# Patient Record
Sex: Female | Born: 1940 | Race: Black or African American | Hispanic: No | Smoking: Former smoker
Health system: Southern US, Community
[De-identification: ages and names within clinical notes are randomized; demographics above are authoritative.]

## PROBLEM LIST (undated history)

## (undated) DIAGNOSIS — M1990 Unspecified osteoarthritis, unspecified site: Secondary | ICD-10-CM

## (undated) DIAGNOSIS — Z5189 Encounter for other specified aftercare: Secondary | ICD-10-CM

## (undated) DIAGNOSIS — R0602 Shortness of breath: Secondary | ICD-10-CM

## (undated) DIAGNOSIS — I10 Essential (primary) hypertension: Secondary | ICD-10-CM

## (undated) DIAGNOSIS — M4800 Spinal stenosis, site unspecified: Secondary | ICD-10-CM

## (undated) DIAGNOSIS — H409 Unspecified glaucoma: Secondary | ICD-10-CM

## (undated) DIAGNOSIS — E785 Hyperlipidemia, unspecified: Secondary | ICD-10-CM

## (undated) DIAGNOSIS — M797 Fibromyalgia: Secondary | ICD-10-CM

## (undated) DIAGNOSIS — T7840XA Allergy, unspecified, initial encounter: Secondary | ICD-10-CM

## (undated) DIAGNOSIS — N289 Disorder of kidney and ureter, unspecified: Secondary | ICD-10-CM

## (undated) HISTORY — DX: Unspecified glaucoma: H40.9

## (undated) HISTORY — DX: Unspecified osteoarthritis, unspecified site: M19.90

## (undated) HISTORY — DX: Hyperlipidemia, unspecified: E78.5

## (undated) HISTORY — PX: CATARACT EXTRACTION: SUR2

## (undated) HISTORY — DX: Encounter for other specified aftercare: Z51.89

## (undated) HISTORY — PX: RECTOVAGINAL FISTULA CLOSURE: SUR265

## (undated) HISTORY — DX: Allergy, unspecified, initial encounter: T78.40XA

## (undated) HISTORY — PX: CHOLECYSTECTOMY: SHX55

## (undated) HISTORY — PX: COLON SURGERY: SHX602

---

## 2000-05-30 ENCOUNTER — Other Ambulatory Visit: Admission: RE | Admit: 2000-05-30 | Discharge: 2000-05-30 | Payer: Self-pay | Admitting: Obstetrics and Gynecology

## 2001-08-20 ENCOUNTER — Other Ambulatory Visit: Admission: RE | Admit: 2001-08-20 | Discharge: 2001-08-20 | Payer: Self-pay | Admitting: Obstetrics and Gynecology

## 2002-02-04 ENCOUNTER — Encounter: Payer: Self-pay | Admitting: Emergency Medicine

## 2002-02-04 ENCOUNTER — Observation Stay (HOSPITAL_COMMUNITY): Admission: EM | Admit: 2002-02-04 | Discharge: 2002-02-05 | Payer: Self-pay

## 2004-03-10 ENCOUNTER — Other Ambulatory Visit: Admission: RE | Admit: 2004-03-10 | Discharge: 2004-03-10 | Payer: Self-pay | Admitting: Obstetrics and Gynecology

## 2005-07-20 ENCOUNTER — Other Ambulatory Visit: Admission: RE | Admit: 2005-07-20 | Discharge: 2005-07-20 | Payer: Self-pay | Admitting: Obstetrics and Gynecology

## 2005-08-04 ENCOUNTER — Encounter: Admission: RE | Admit: 2005-08-04 | Discharge: 2005-08-04 | Payer: Self-pay | Admitting: Obstetrics and Gynecology

## 2006-02-27 ENCOUNTER — Encounter: Admission: RE | Admit: 2006-02-27 | Discharge: 2006-02-27 | Payer: Self-pay

## 2006-03-06 ENCOUNTER — Encounter: Admission: RE | Admit: 2006-03-06 | Discharge: 2006-03-06 | Payer: Self-pay

## 2006-03-20 ENCOUNTER — Encounter: Admission: RE | Admit: 2006-03-20 | Discharge: 2006-03-20 | Payer: Self-pay

## 2006-04-03 ENCOUNTER — Encounter: Admission: RE | Admit: 2006-04-03 | Discharge: 2006-04-03 | Payer: Self-pay

## 2009-10-26 ENCOUNTER — Encounter (INDEPENDENT_AMBULATORY_CARE_PROVIDER_SITE_OTHER): Payer: Self-pay | Admitting: Surgery

## 2009-10-26 ENCOUNTER — Inpatient Hospital Stay (HOSPITAL_COMMUNITY): Admission: RE | Admit: 2009-10-26 | Discharge: 2009-11-05 | Payer: Self-pay | Admitting: Surgery

## 2009-10-29 ENCOUNTER — Encounter: Payer: Self-pay | Admitting: Internal Medicine

## 2009-10-29 ENCOUNTER — Ambulatory Visit: Payer: Self-pay | Admitting: Internal Medicine

## 2010-01-28 ENCOUNTER — Ambulatory Visit (HOSPITAL_COMMUNITY): Admission: RE | Admit: 2010-01-28 | Discharge: 2010-01-28 | Payer: Self-pay | Admitting: Surgery

## 2010-01-29 ENCOUNTER — Encounter (INDEPENDENT_AMBULATORY_CARE_PROVIDER_SITE_OTHER): Payer: Self-pay | Admitting: Surgery

## 2010-01-29 ENCOUNTER — Inpatient Hospital Stay (HOSPITAL_COMMUNITY): Admission: RE | Admit: 2010-01-29 | Discharge: 2010-02-01 | Payer: Self-pay | Admitting: Surgery

## 2010-09-15 ENCOUNTER — Encounter
Admission: RE | Admit: 2010-09-15 | Discharge: 2010-11-15 | Payer: Self-pay | Source: Home / Self Care | Attending: Neurology | Admitting: Neurology

## 2010-12-13 IMAGING — CR DG CHEST 2V
2 series · 2 of 2 positions shown · non-contrast
Comparison: None

CLINICAL DATA: History of tobacco smoking.  Preoperative
cardiopulmonary evaluation.

CHEST - 2 VIEW

[w chest pa]
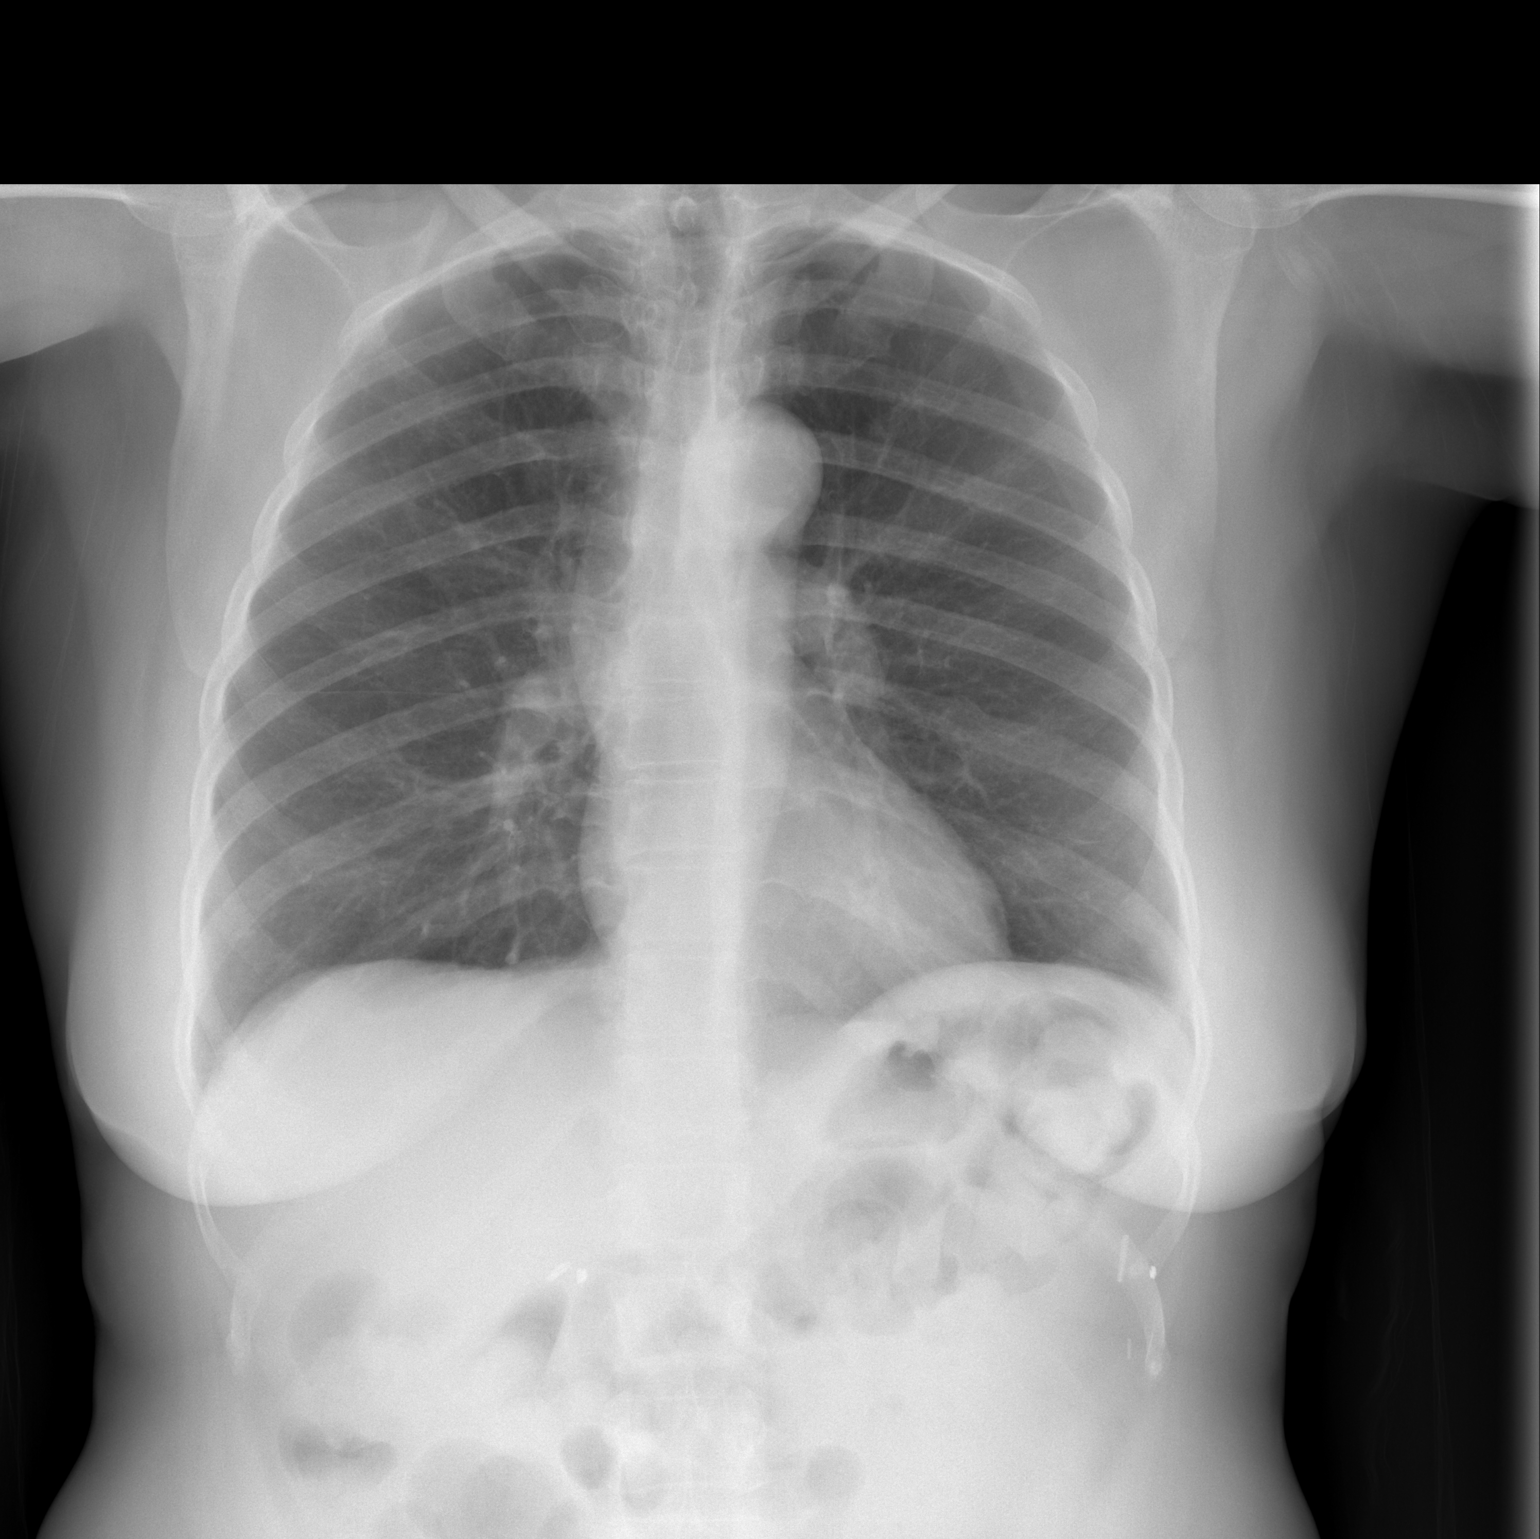

[w chest lat]
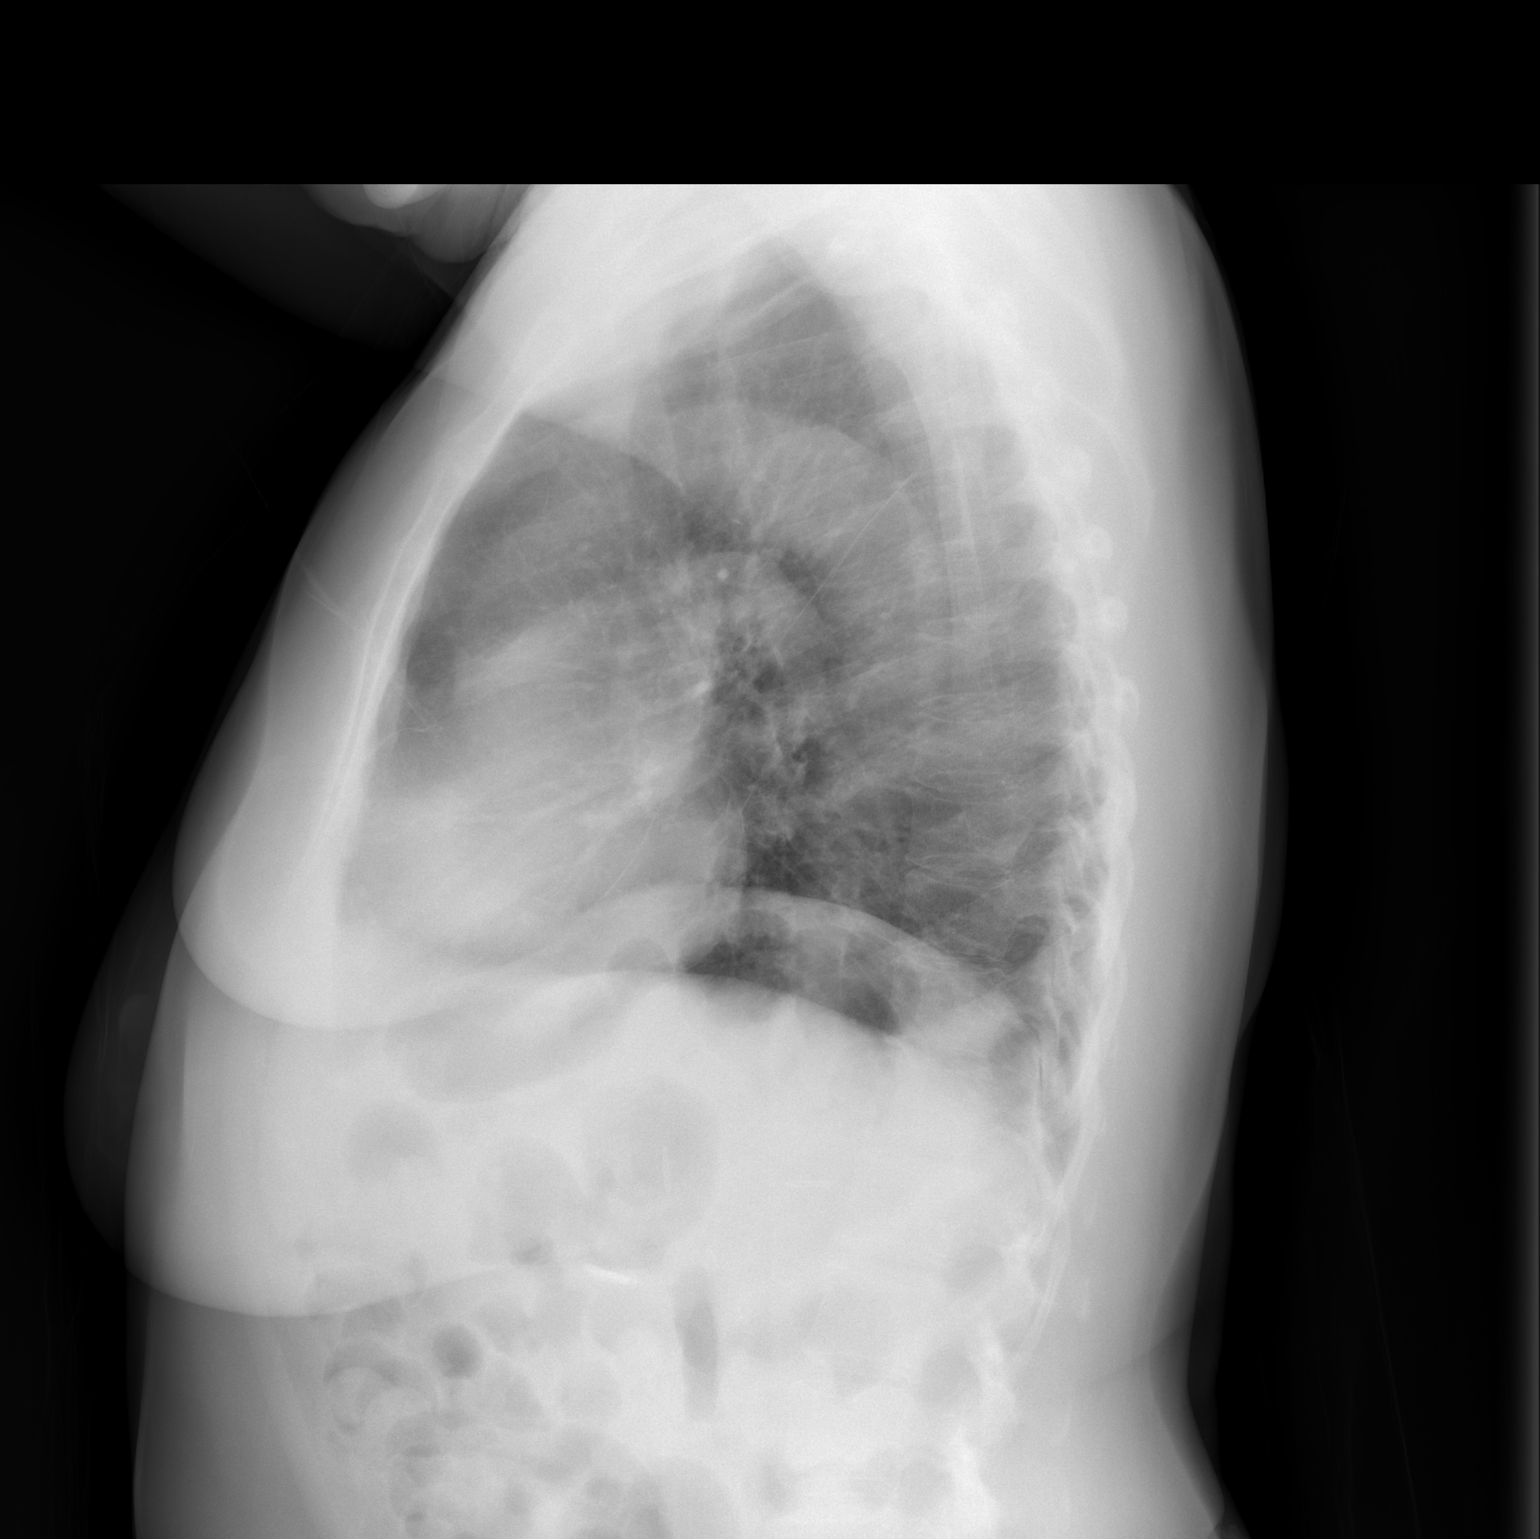

[2 of 2 positions shown; findings below may reference images not displayed]

FINDINGS: The cardiac silhouette is normal size and shape. Ectasia
and nonaneurysmal calcification of the thoracic aorta is seen. The
lungs are well aerated and free of infiltrates. No pleural
abnormality is evident. There is a mildly osteopenic appearance of
the bones.
IMPRESSION: No acute or active cardiopulmonary process is seen.

## 2010-12-20 IMAGING — CR DG CHEST 1V PORT
1 series · 1 of 1 positions shown · non-contrast
Comparison: 10/21/2009

CLINICAL DATA: Fistula.  Shortness of breath.

PORTABLE CHEST - 1 VIEW

[view not recorded]
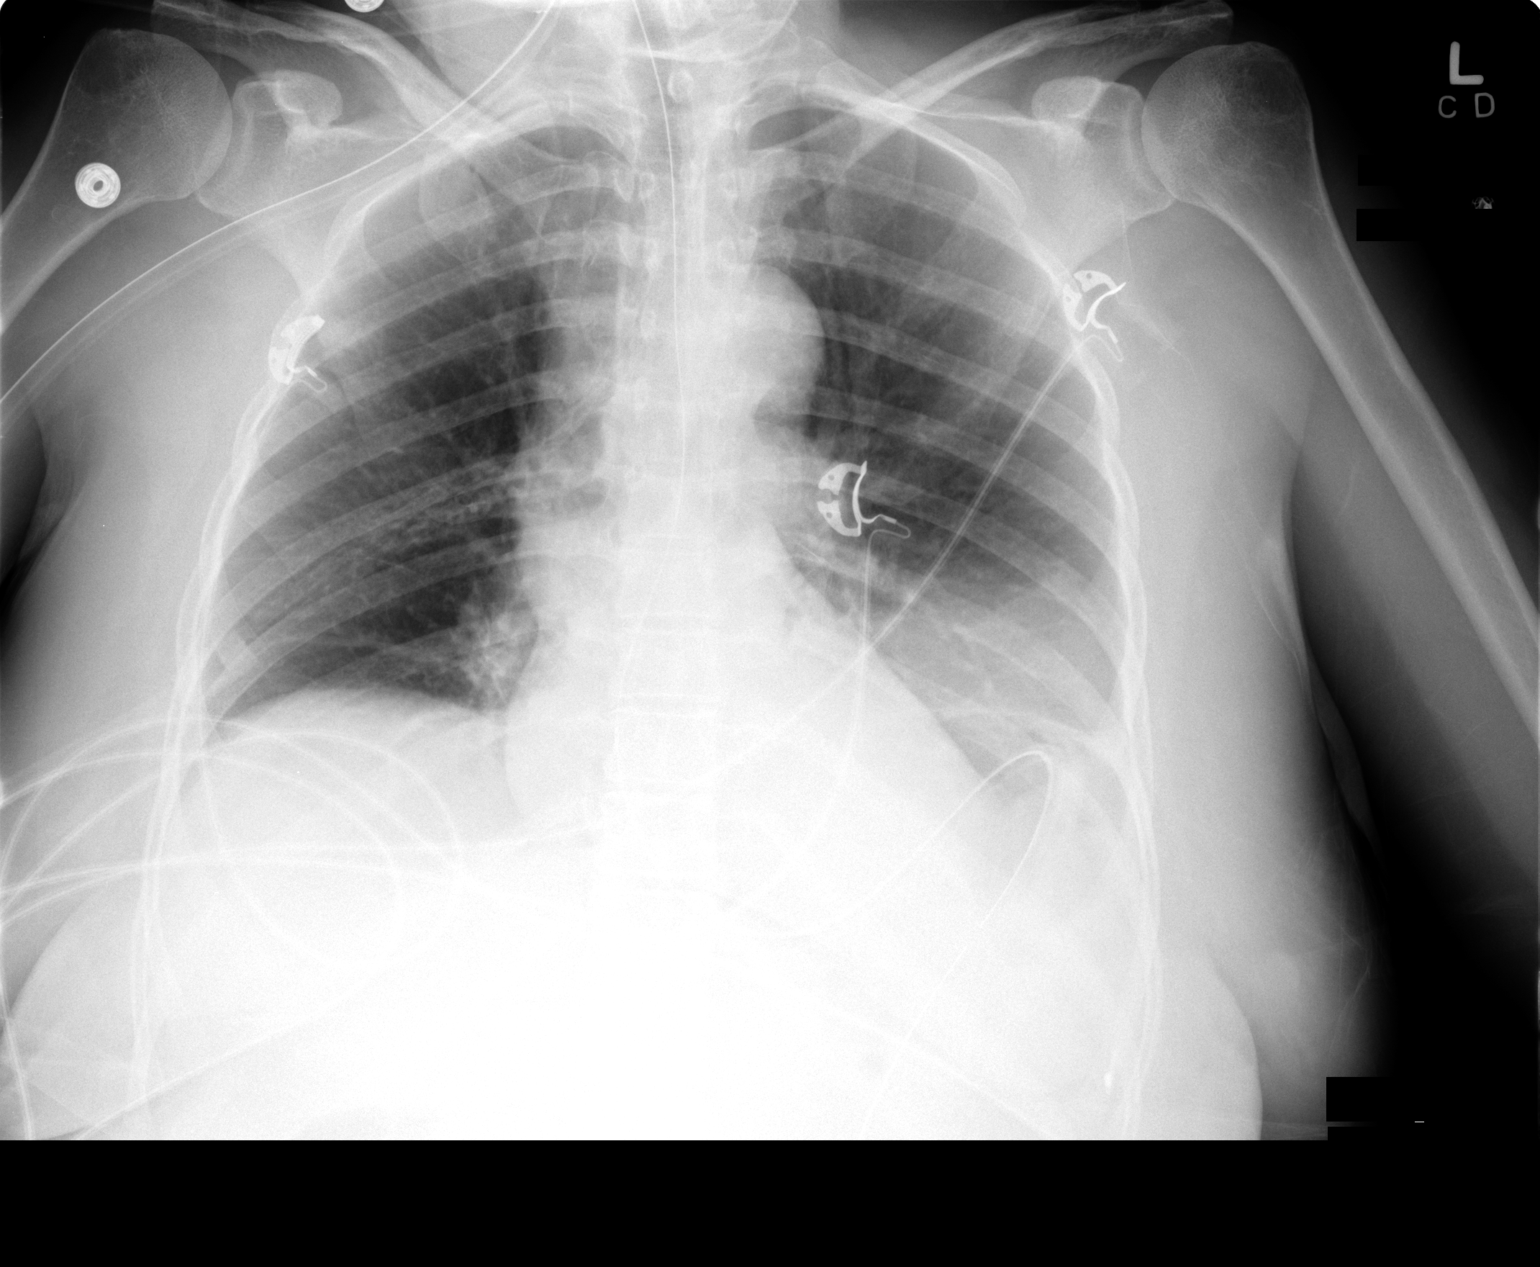

[1 of 1 positions shown; findings below may reference images not displayed]

FINDINGS: Nasogastric tube extends into the decompressed stomach.
New dense consolidation / atelectasis in the left lower lobe.
Linear scarring or atelectasis at the right lung base.  Heart size
normal.  Vascular clips in the right upper abdomen.  Visualized
bones unremarkable.
IMPRESSION: 1.  New dense left lower lobe consolidation / atelectasis.
2.  Nasogastric tube into the stomach.

## 2010-12-21 IMAGING — CR DG CHEST 1V PORT
1 series · 1 of 1 positions shown · non-contrast
Comparison: Chest x-ray of 10/28/2009

CLINICAL DATA: Postop for repair of a colovesical and colovaginal
fistula

PORTABLE CHEST - 1 VIEW

[view not recorded]
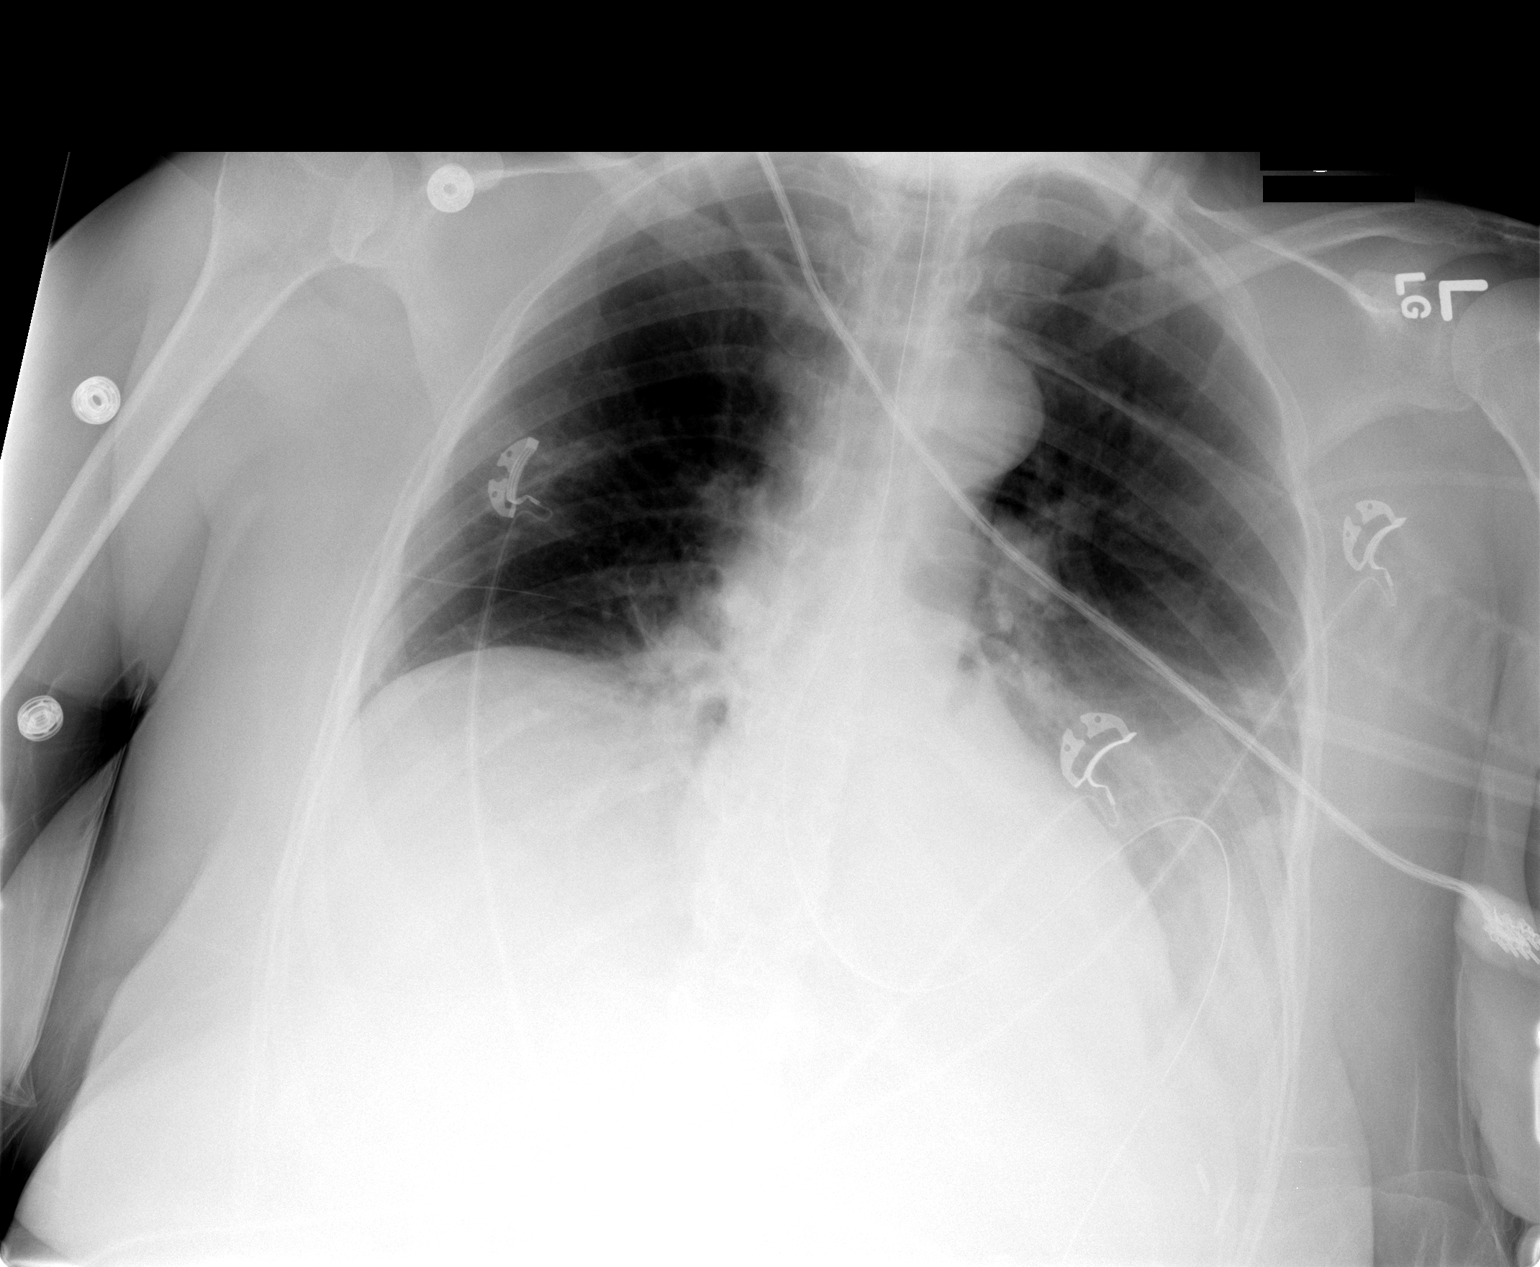

[1 of 1 positions shown; findings below may reference images not displayed]

FINDINGS: There is little change in opacity at the left lung base
consistent with atelectasis, pneumonia, and possibly left pleural
effusion.  Mild right basilar atelectasis is noted.  The heart is
within upper limits normal.  NG tube is noted within the stomach
with the tip not visualized.
IMPRESSION: Persistent left basilar opacity consistent with atelectasis,
pneumonia, possible left effusion.

## 2010-12-25 IMAGING — CR DG CHEST 2V
2 series · 2 of 2 positions shown · non-contrast
Comparison: 10/30/2009

CLINICAL DATA: colovesicle and colovaginal fistulas.  Hypertension.

CHEST - 2 VIEW

[w chest pa]
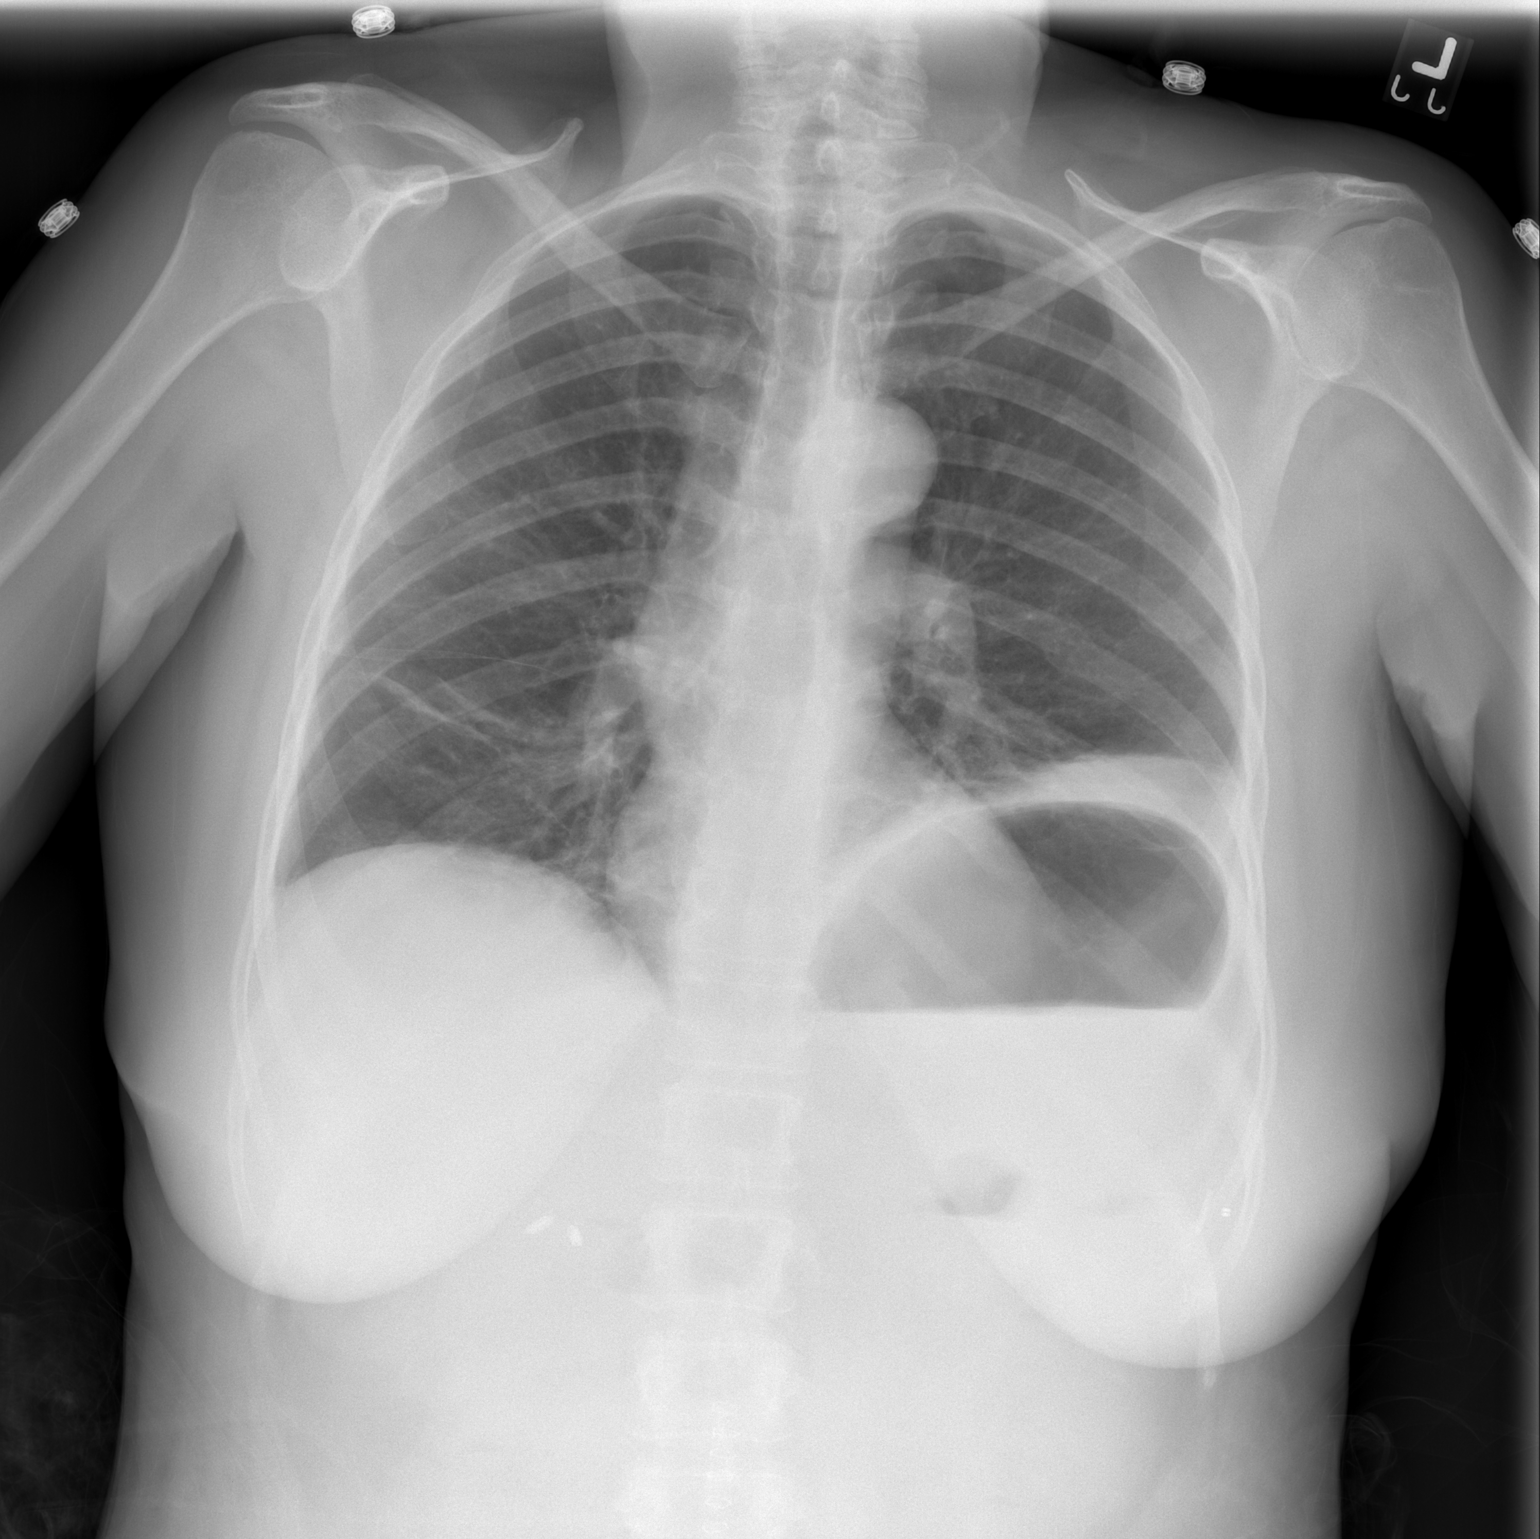

[w chest lat]
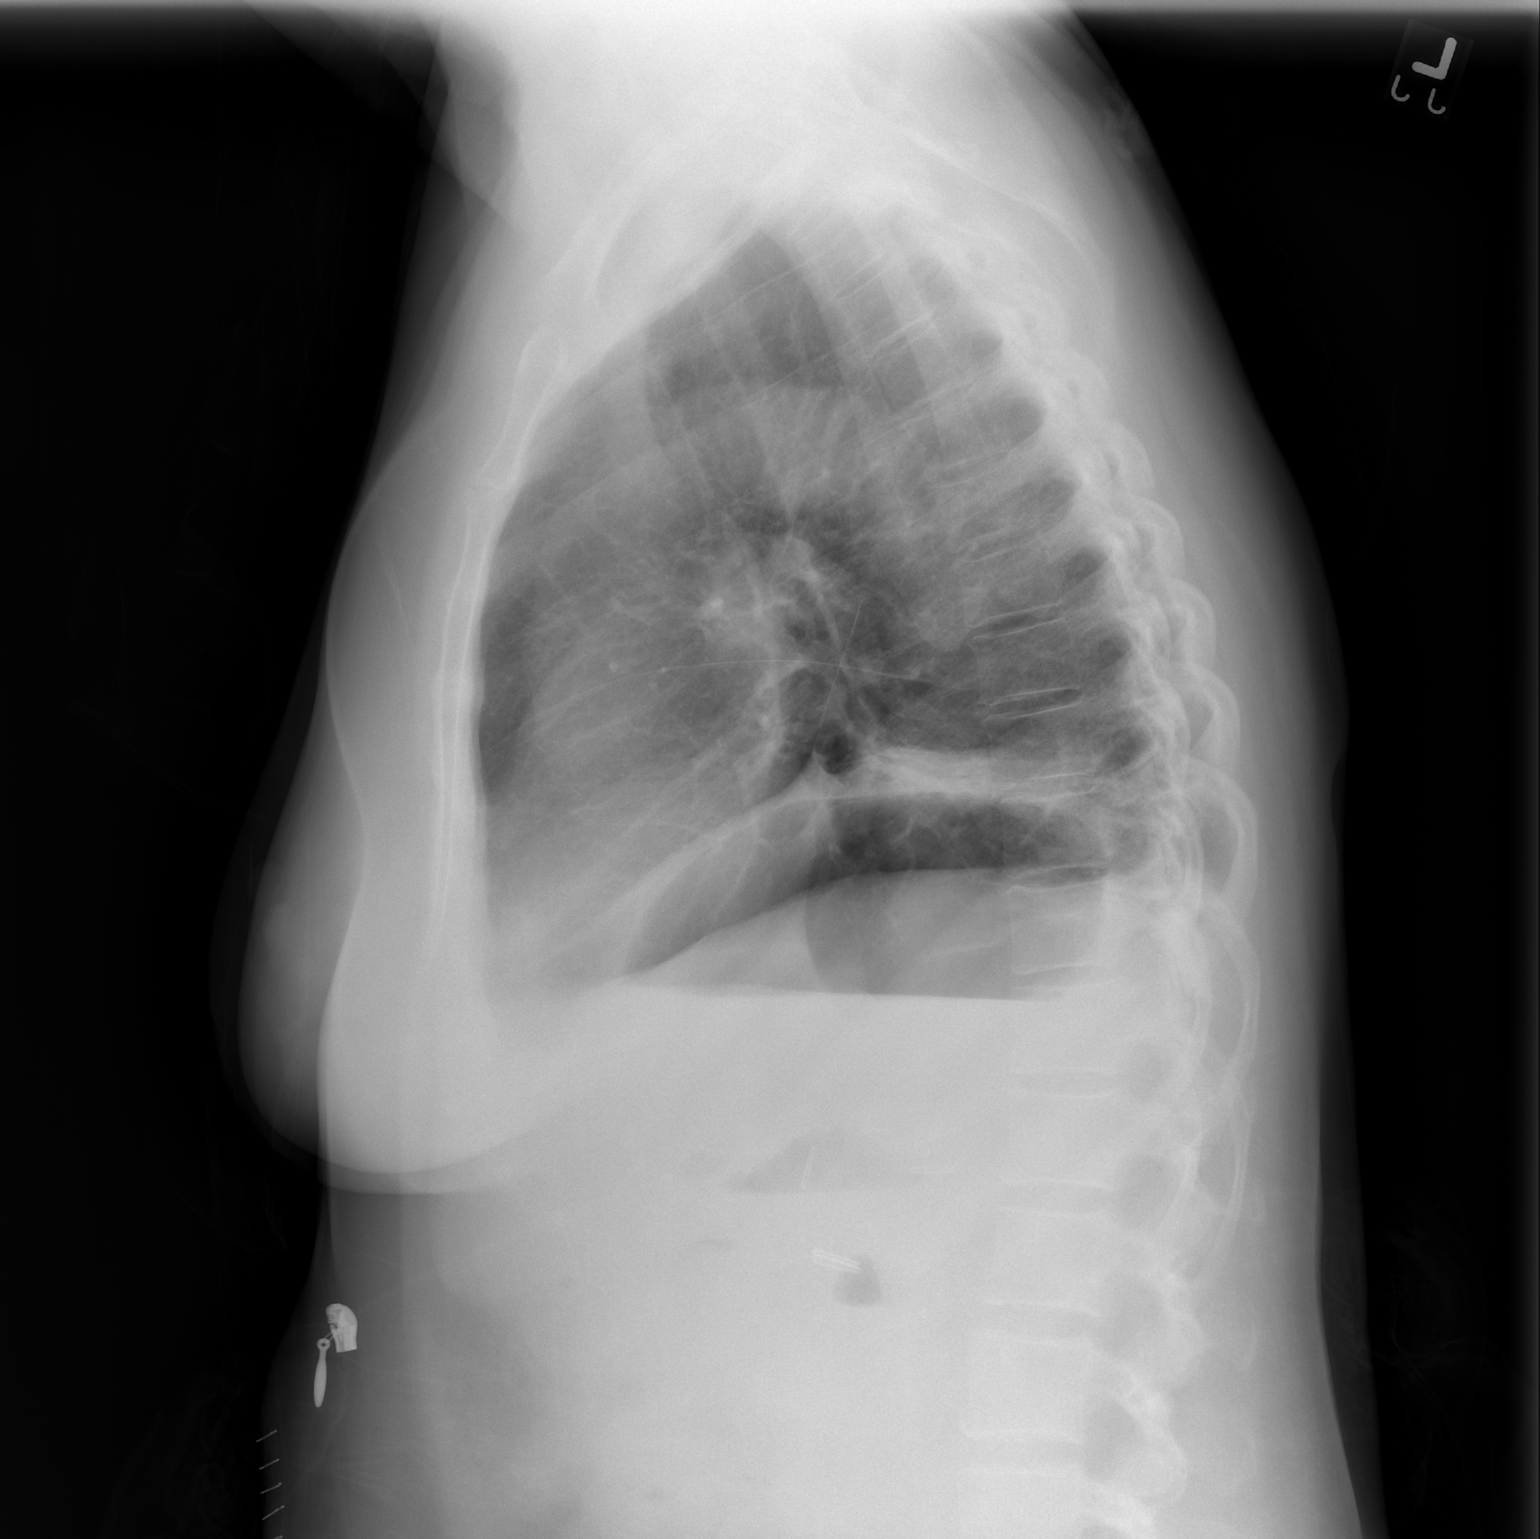

[2 of 2 positions shown; findings below may reference images not displayed]

FINDINGS: Subsegmental atelectasis at the right base.  Segmental
atelectasis at the posterior left base.  Some improvement in left
lower lobe aeration.
IMPRESSION: Atelectasis at the bases.  There is some improvement in left base
aeration.

## 2011-02-21 LAB — BASIC METABOLIC PANEL
CO2: 20 mEq/L (ref 19–32)
Sodium: 133 mEq/L — ABNORMAL LOW (ref 135–145)

## 2011-02-21 LAB — URINALYSIS, ROUTINE W REFLEX MICROSCOPIC
Bilirubin Urine: NEGATIVE
Glucose, UA: NEGATIVE mg/dL
Ketones, ur: NEGATIVE mg/dL
Specific Gravity, Urine: 1.021 (ref 1.005–1.030)
pH: 5.5 (ref 5.0–8.0)

## 2011-02-21 LAB — PROTIME-INR
INR: 1.08 (ref 0.00–1.49)
Prothrombin Time: 13.9 seconds (ref 11.6–15.2)

## 2011-02-21 LAB — CBC
Hemoglobin: 10.6 g/dL — ABNORMAL LOW (ref 12.0–15.0)
MCHC: 33.4 g/dL (ref 30.0–36.0)
Platelets: 302 10*3/uL (ref 150–400)
RBC: 3.53 MIL/uL — ABNORMAL LOW (ref 3.87–5.11)

## 2011-02-21 LAB — DIFFERENTIAL
Basophils Relative: 1 % (ref 0–1)
Eosinophils Absolute: 0.7 10*3/uL (ref 0.0–0.7)
Eosinophils Relative: 8 % — ABNORMAL HIGH (ref 0–5)
Lymphocytes Relative: 10 % — ABNORMAL LOW (ref 12–46)
Lymphs Abs: 0.9 10*3/uL (ref 0.7–4.0)
Monocytes Relative: 6 % (ref 3–12)
Neutrophils Relative %: 76 % (ref 43–77)

## 2011-02-21 LAB — URINE MICROSCOPIC-ADD ON

## 2011-03-01 LAB — CBC
HCT: 24.1 % — ABNORMAL LOW (ref 36.0–46.0)
HCT: 27.4 % — ABNORMAL LOW (ref 36.0–46.0)
Hemoglobin: 7.7 g/dL — ABNORMAL LOW (ref 12.0–15.0)
Hemoglobin: 8 g/dL — ABNORMAL LOW (ref 12.0–15.0)
Hemoglobin: 9.1 g/dL — ABNORMAL LOW (ref 12.0–15.0)
MCHC: 33.3 g/dL (ref 30.0–36.0)
MCHC: 33.3 g/dL (ref 30.0–36.0)
MCV: 89.7 fL (ref 78.0–100.0)
MCV: 89.9 fL (ref 78.0–100.0)
MCV: 90.9 fL (ref 78.0–100.0)
MCV: 92.8 fL (ref 78.0–100.0)
Platelets: 201 10*3/uL (ref 150–400)
Platelets: 226 10*3/uL (ref 150–400)
Platelets: 491 10*3/uL — ABNORMAL HIGH (ref 150–400)
RBC: 2.5 MIL/uL — ABNORMAL LOW (ref 3.87–5.11)
RBC: 2.65 MIL/uL — ABNORMAL LOW (ref 3.87–5.11)
RBC: 2.82 MIL/uL — ABNORMAL LOW (ref 3.87–5.11)
RBC: 3.05 MIL/uL — ABNORMAL LOW (ref 3.87–5.11)
WBC: 14.9 10*3/uL — ABNORMAL HIGH (ref 4.0–10.5)
WBC: 15.6 10*3/uL — ABNORMAL HIGH (ref 4.0–10.5)

## 2011-03-01 LAB — GLUCOSE, CAPILLARY
Glucose-Capillary: 104 mg/dL — ABNORMAL HIGH (ref 70–99)
Glucose-Capillary: 104 mg/dL — ABNORMAL HIGH (ref 70–99)
Glucose-Capillary: 106 mg/dL — ABNORMAL HIGH (ref 70–99)
Glucose-Capillary: 108 mg/dL — ABNORMAL HIGH (ref 70–99)
Glucose-Capillary: 113 mg/dL — ABNORMAL HIGH (ref 70–99)
Glucose-Capillary: 118 mg/dL — ABNORMAL HIGH (ref 70–99)
Glucose-Capillary: 119 mg/dL — ABNORMAL HIGH (ref 70–99)
Glucose-Capillary: 119 mg/dL — ABNORMAL HIGH (ref 70–99)
Glucose-Capillary: 119 mg/dL — ABNORMAL HIGH (ref 70–99)
Glucose-Capillary: 120 mg/dL — ABNORMAL HIGH (ref 70–99)
Glucose-Capillary: 120 mg/dL — ABNORMAL HIGH (ref 70–99)
Glucose-Capillary: 137 mg/dL — ABNORMAL HIGH (ref 70–99)
Glucose-Capillary: 75 mg/dL (ref 70–99)
Glucose-Capillary: 96 mg/dL (ref 70–99)
Glucose-Capillary: 99 mg/dL (ref 70–99)

## 2011-03-01 LAB — WOUND CULTURE: Culture: NO GROWTH

## 2011-03-01 LAB — URINE MICROSCOPIC-ADD ON

## 2011-03-01 LAB — URINALYSIS, ROUTINE W REFLEX MICROSCOPIC
Ketones, ur: NEGATIVE mg/dL
Nitrite: NEGATIVE
Protein, ur: NEGATIVE mg/dL
pH: 5.5 (ref 5.0–8.0)

## 2011-03-01 LAB — BASIC METABOLIC PANEL
BUN: 6 mg/dL (ref 6–23)
CO2: 20 mEq/L (ref 19–32)
CO2: 21 mEq/L (ref 19–32)
CO2: 25 mEq/L (ref 19–32)
Calcium: 8 mg/dL — ABNORMAL LOW (ref 8.4–10.5)
Calcium: 8.1 mg/dL — ABNORMAL LOW (ref 8.4–10.5)
Calcium: 8.4 mg/dL (ref 8.4–10.5)
Chloride: 106 mEq/L (ref 96–112)
Chloride: 107 mEq/L (ref 96–112)
Chloride: 108 mEq/L (ref 96–112)
Chloride: 99 mEq/L (ref 96–112)
Creatinine, Ser: 0.8 mg/dL (ref 0.4–1.2)
Creatinine, Ser: 0.99 mg/dL (ref 0.4–1.2)
GFR calc Af Amer: >60 mL/min (ref >60)
GFR calc Af Amer: >60 mL/min (ref >60)
GFR calc Af Amer: >60 mL/min (ref >60)
GFR calc non Af Amer: >60 mL/min (ref >60)
Glucose, Bld: 122 mg/dL — ABNORMAL HIGH (ref 70–99)
Potassium: 3.8 mEq/L (ref 3.5–5.1)
Potassium: 4.2 mEq/L (ref 3.5–5.1)
Sodium: 135 mEq/L (ref 135–145)
Sodium: 137 mEq/L (ref 135–145)
Sodium: 139 mEq/L (ref 135–145)

## 2011-03-01 LAB — URINE CULTURE
Colony Count: NO GROWTH
Culture: NO GROWTH

## 2011-03-01 LAB — CULTURE, BLOOD (ROUTINE X 2)
Culture: NO GROWTH
Culture: NO GROWTH

## 2011-03-01 LAB — PREPARE RBC (CROSSMATCH)

## 2011-03-02 LAB — ABO/RH: ABO/RH(D): A POS

## 2011-03-02 LAB — CROSSMATCH
ABO/RH(D): A POS
Antibody Screen: NEGATIVE

## 2011-03-02 LAB — BASIC METABOLIC PANEL
CO2: 26 mEq/L (ref 19–32)
Calcium: 7.1 mg/dL — ABNORMAL LOW (ref 8.4–10.5)
Calcium: 9.7 mg/dL (ref 8.4–10.5)
Creatinine, Ser: 0.92 mg/dL (ref 0.4–1.2)
GFR calc Af Amer: >60 mL/min (ref >60)
GFR calc non Af Amer: 56 mL/min — ABNORMAL LOW (ref >60)
GFR calc non Af Amer: >60 mL/min (ref >60)
Glucose, Bld: 115 mg/dL — ABNORMAL HIGH (ref 70–99)
Glucose, Bld: 178 mg/dL — ABNORMAL HIGH (ref 70–99)
Potassium: 4.5 mEq/L (ref 3.5–5.1)
Sodium: 139 mEq/L (ref 135–145)

## 2011-03-02 LAB — GLUCOSE, CAPILLARY
Glucose-Capillary: 101 mg/dL — ABNORMAL HIGH (ref 70–99)
Glucose-Capillary: 136 mg/dL — ABNORMAL HIGH (ref 70–99)

## 2011-03-02 LAB — DIFFERENTIAL
Eosinophils Relative: 6 % — ABNORMAL HIGH (ref 0–5)
Lymphocytes Relative: 13 % (ref 12–46)
Lymphs Abs: 1.1 10*3/uL (ref 0.7–4.0)
Monocytes Absolute: 0.7 10*3/uL (ref 0.1–1.0)
Monocytes Relative: 8 % (ref 3–12)
Neutro Abs: 6.3 10*3/uL (ref 1.7–7.7)

## 2011-03-02 LAB — POCT I-STAT EG7
Bicarbonate: 26.4 mEq/L — ABNORMAL HIGH (ref 20.0–24.0)
HCT: 34 % — ABNORMAL LOW (ref 36.0–46.0)
Hemoglobin: 11.6 g/dL — ABNORMAL LOW (ref 12.0–15.0)
Patient temperature: 37
Potassium: 3.9 mEq/L (ref 3.5–5.1)
Sodium: 134 mEq/L — ABNORMAL LOW (ref 135–145)
pH, Ven: 7.337 — ABNORMAL HIGH (ref 7.250–7.300)
pO2, Ven: 176 mmHg — ABNORMAL HIGH (ref 30.0–45.0)

## 2011-03-02 LAB — URINALYSIS, ROUTINE W REFLEX MICROSCOPIC
Bilirubin Urine: NEGATIVE
Ketones, ur: NEGATIVE mg/dL
Nitrite: NEGATIVE
pH: 5.5 (ref 5.0–8.0)

## 2011-03-02 LAB — CBC
HCT: 37.3 % (ref 36.0–46.0)
Hemoglobin: 12.7 g/dL (ref 12.0–15.0)
Platelets: 259 10*3/uL (ref 150–400)
RBC: 2.94 MIL/uL — ABNORMAL LOW (ref 3.87–5.11)
RBC: 4.14 MIL/uL (ref 3.87–5.11)
RDW: 14 % (ref 11.5–15.5)
WBC: 14.2 10*3/uL — ABNORMAL HIGH (ref 4.0–10.5)
WBC: 8.6 10*3/uL (ref 4.0–10.5)

## 2011-03-02 LAB — URINE MICROSCOPIC-ADD ON

## 2011-04-15 NOTE — H&P (Signed)
[place]  Patient:    Hannah Conrad, Hannah Conrad Visit Number: [id number] MRN: [id number]          Service Type: MED Location: [id number] Attending Physician:  Veneda Melter Dictated by:   Veneda Melter, M.D. [facility] Admit Date:  02/04/2002   CC:         Fayette Pho, M.D., [facility], [place]   History and Physical  HISTORY:  Ms. Veno is a 70 year old black female with no prior cardiac history who presents with substernal chest discomfort.  The patient has a history of hypertension, dyslipidemia and tobacco use and has been under significant stress in the past few weeks due to her mothers demise.  She has had family squabbles regarding arrangements for the funeral, which is scheduled for tomorrow.  At approximately 11:00 this a.m., she started developing substernal chest pressure with radiation to the left shoulder; this was described as 5/10.  There was some jaw discomfort as well.  She denied any shortness of breath but felt nauseated.  She did not have any vomiting or diaphoresis.  This waxed and waned through the day.  She did not feel like eating much but when she did eat, this provoked nausea.  She finally presented to the emergency room where her pain was diminished with nitroglycerin but not completely relieved.  Currently, she complains of 1-2/10 chest pressure.  She reports being very anxious but no shortness of breath or diaphoresis.  She has not had any recent syncope or presyncope, no bright red blood per rectum, melena, hematuria or dysuria.  She does have a history of fibromyalgia and has myalgias and arthralgias on a chronic basis but this feels somewhat different and more severe than usual pain.  REVIEW OF SYSTEMS:  Otherwise only notable for some postnasal drip.  PAST MEDICAL HISTORY:  Notable for hyperlipidemia and hypertension.  She has had colon resection as well as cholecystectomy, fibromyalgia, diverticulitis and she has chronic postnasal  drip.  ALLERGIES:  CIPROFLOXACIN and SULFA MEDICATIONS.  CURRENT MEDICATIONS: 1. Premarin 0.625 mg q.d. 2. Lotensin at unknown dosage. 3. Hydrochlorothiazide at unknown dosage. 4. Dynacirc. 5. Vioxx 25 mg q.d. 6. Imipramine 50 mg q.d. as needed. 7. Provigil 200 mg q.d.  SOCIAL HISTORY:  Patient lives alone, she is divorced, lives in [place]. She is the Programmer, multimedia for [employer].  Long history of tobacco use of 41-pack years, currently smoking.  Denies alcohol use.  Does not exercise regularly.  FAMILY HISTORY:  Mother died early this week.  Father unknown.  No familial history of coronary disease.  PHYSICAL EXAMINATION:  GENERAL:  She is a well-developed, well-nourished black female in no acute distress.  VITAL SIGNS:  Blood pressure 155/83, pulse 88, respirations 18, temperature -- afebrile.  HEENT:  Pupils are equal, round and reactive to light.  Extraocular muscles are intact.  Oropharynx shows no lesion.  NECK:  Supple.  Carotid upstrokes are brisk.  No adenopathy.  No carotid bruits.  HEART:  Regular rate without murmurs.  LUNGS:  Clear to auscultation.  ABDOMEN:  Soft, nontender.  No masses.  EXTREMITIES:  Peripheral pulses are 2+ and equal bilaterally.  There is no peripheral edema.  Motor strength is 5/5.  Sensory is intact to touch. Cranial nerves II-XII are intact.  She is fully alert and oriented.  LABORATORY AND ACCESSORY DATA:  Chest x-ray shows left lower lobe atelectasis but no acute infiltrates, normal cardiac silhouette.  ECG is normal sinus at 89 with no  acute ischemic changes.  Laboratory data is notable for a white count of 7.5, hemoglobin 12.1, hematocrit 34.8, platelets 292,000.  Sodium is 137, potassium 3.5, chloride 102, bicarb 26, BUN 8, creatinine 0.9, glucose 97.  Total CK is 48, MB is 0.4, troponin I is 0.01.  ASSESSMENT AND PLAN:  Ms. Cadle is a 70 year old black female who presents with substernal chest discomfort.  She  has significant risk factors for cardiovascular disease.  At present, her electrocardiogram and cardiac enzymes are negative, however, her history is worrisome for unstable angina.  She will be admitted to a telemetry monitor.  We will continue anti-anginals and antihypertensive medications.  We will replete her potassium and follow her cardiac enzymes as well as her electrocardiogram.  Should she rule out, further assessment with either a stress imaging study or coronary angiography may be considered.  Should she have any evidence of myocardial injury or continued discomfort, we will proceed directly to cardiac catheterization. The patient understands and agrees with this plan. Dictated by:   Veneda Melter, M.D. [facility] Attending Physician:  Veneda Melter DD:  02/04/02 TD:  02/05/02 Job: [id number]

## 2012-05-15 ENCOUNTER — Other Ambulatory Visit: Payer: Self-pay | Admitting: Obstetrics and Gynecology

## 2013-03-15 ENCOUNTER — Inpatient Hospital Stay (HOSPITAL_COMMUNITY)
Admission: EM | Admit: 2013-03-15 | Discharge: 2013-03-17 | DRG: 192 | Disposition: A | Discharge status: Home Health | Payer: MEDICARE | Attending: Internal Medicine | Admitting: Internal Medicine

## 2013-03-15 ENCOUNTER — Encounter (HOSPITAL_COMMUNITY): Payer: Self-pay | Admitting: *Deleted

## 2013-03-15 ENCOUNTER — Emergency Department (HOSPITAL_COMMUNITY): Payer: MEDICARE

## 2013-03-15 ENCOUNTER — Inpatient Hospital Stay (HOSPITAL_COMMUNITY): Payer: MEDICARE

## 2013-03-15 DIAGNOSIS — J40 Bronchitis, not specified as acute or chronic: Secondary | ICD-10-CM

## 2013-03-15 DIAGNOSIS — M797 Fibromyalgia: Secondary | ICD-10-CM

## 2013-03-15 DIAGNOSIS — E876 Hypokalemia: Secondary | ICD-10-CM | POA: Diagnosis present

## 2013-03-15 DIAGNOSIS — J441 Chronic obstructive pulmonary disease with (acute) exacerbation: Principal | ICD-10-CM | POA: Diagnosis present

## 2013-03-15 DIAGNOSIS — I129 Hypertensive chronic kidney disease with stage 1 through stage 4 chronic kidney disease, or unspecified chronic kidney disease: Secondary | ICD-10-CM | POA: Diagnosis present

## 2013-03-15 DIAGNOSIS — N182 Chronic kidney disease, stage 2 (mild): Secondary | ICD-10-CM | POA: Diagnosis present

## 2013-03-15 DIAGNOSIS — Z79899 Other long term (current) drug therapy: Secondary | ICD-10-CM

## 2013-03-15 DIAGNOSIS — Z7982 Long term (current) use of aspirin: Secondary | ICD-10-CM

## 2013-03-15 DIAGNOSIS — R197 Diarrhea, unspecified: Secondary | ICD-10-CM

## 2013-03-15 DIAGNOSIS — Z87891 Personal history of nicotine dependence: Secondary | ICD-10-CM

## 2013-03-15 DIAGNOSIS — IMO0001 Reserved for inherently not codable concepts without codable children: Secondary | ICD-10-CM | POA: Diagnosis present

## 2013-03-15 DIAGNOSIS — I10 Essential (primary) hypertension: Secondary | ICD-10-CM

## 2013-03-15 HISTORY — DX: Shortness of breath: R06.02

## 2013-03-15 HISTORY — DX: Spinal stenosis, site unspecified: M48.00

## 2013-03-15 HISTORY — DX: Fibromyalgia: M79.7

## 2013-03-15 HISTORY — DX: Disorder of kidney and ureter, unspecified: N28.9

## 2013-03-15 HISTORY — DX: Essential (primary) hypertension: I10

## 2013-03-15 LAB — CBC
MCV: 90.8 fL (ref 78.0–100.0)
Platelets: 198 10*3/uL (ref 150–400)
RBC: 4.15 MIL/uL (ref 3.87–5.11)
RDW: 14.3 % (ref 11.5–15.5)
WBC: 4.9 10*3/uL (ref 4.0–10.5)

## 2013-03-15 LAB — BASIC METABOLIC PANEL
Calcium: 8.8 mg/dL (ref 8.4–10.5)
GFR calc Af Amer: 57 mL/min — ABNORMAL LOW (ref >90)
GFR calc non Af Amer: 49 mL/min — ABNORMAL LOW (ref >90)
Glucose, Bld: 101 mg/dL — ABNORMAL HIGH (ref 70–99)
Sodium: 136 mEq/L (ref 135–145)

## 2013-03-15 LAB — CBC WITH DIFFERENTIAL/PLATELET
Basophils Absolute: 0 10*3/uL (ref 0.0–0.1)
Basophils Relative: 0 % (ref 0–1)
Eosinophils Absolute: 0 10*3/uL (ref 0.0–0.7)
Eosinophils Relative: 0 % (ref 0–5)
Lymphs Abs: 0.6 10*3/uL — ABNORMAL LOW (ref 0.7–4.0)
MCH: 30.4 pg (ref 26.0–34.0)
MCHC: 33.6 g/dL (ref 30.0–36.0)
MCV: 90.5 fL (ref 78.0–100.0)
Platelets: 178 10*3/uL (ref 150–400)
RDW: 14.3 % (ref 11.5–15.5)

## 2013-03-15 LAB — PRO B NATRIURETIC PEPTIDE: Pro B Natriuretic peptide (BNP): 256.8 pg/mL — ABNORMAL HIGH (ref 0–125)

## 2013-03-15 LAB — CREATININE, SERUM
Creatinine, Ser: 1 mg/dL (ref 0.50–1.10)
GFR calc Af Amer: 65 mL/min — ABNORMAL LOW (ref >90)
GFR calc non Af Amer: 56 mL/min — ABNORMAL LOW (ref >90)

## 2013-03-15 LAB — TSH: TSH: 0.243 u[IU]/mL — ABNORMAL LOW (ref 0.350–4.500)

## 2013-03-15 MED ORDER — ALBUTEROL SULFATE (5 MG/ML) 0.5% IN NEBU
2.5000 mg | INHALATION_SOLUTION | RESPIRATORY_TRACT | Status: DC | PRN
  Administered 2013-03-15 – 2013-03-16 (×2): 2.5 mg via RESPIRATORY_TRACT
  Filled 2013-03-15 (×2): qty 0.5

## 2013-03-15 MED ORDER — ONDANSETRON HCL 4 MG PO TABS: 4.0000 mg | ORAL_TABLET | Freq: Four times a day (QID) | ORAL | Status: DC | PRN

## 2013-03-15 MED ORDER — ENOXAPARIN SODIUM 40 MG/0.4ML ~~LOC~~ SOLN
40.0000 mg | SUBCUTANEOUS | Status: DC
  Administered 2013-03-15 – 2013-03-16 (×2): 40 mg via SUBCUTANEOUS
  Filled 2013-03-15 (×3): qty 0.4

## 2013-03-15 MED ORDER — TECHNETIUM TC 99M DIETHYLENETRIAME-PENTAACETIC ACID: 39.1000 | Freq: Once | INTRAVENOUS | Status: AC | PRN

## 2013-03-15 MED ORDER — ONDANSETRON HCL 4 MG/2ML IJ SOLN: 4.0000 mg | Freq: Four times a day (QID) | INTRAMUSCULAR | Status: DC | PRN

## 2013-03-15 MED ORDER — FELODIPINE ER 10 MG PO TB24
10.0000 mg | ORAL_TABLET | Freq: Every evening | ORAL | Status: DC
  Administered 2013-03-15 – 2013-03-17 (×3): 10 mg via ORAL
  Filled 2013-03-15 (×3): qty 1

## 2013-03-15 MED ORDER — BENAZEPRIL HCL 10 MG PO TABS
10.0000 mg | ORAL_TABLET | Freq: Every day | ORAL | Status: DC
  Administered 2013-03-15 – 2013-03-17 (×3): 10 mg via ORAL
  Filled 2013-03-15 (×3): qty 1

## 2013-03-15 MED ORDER — IPRATROPIUM BROMIDE 0.02 % IN SOLN
0.5000 mg | RESPIRATORY_TRACT | Status: DC | PRN
  Administered 2013-03-15 – 2013-03-16 (×2): 0.5 mg via RESPIRATORY_TRACT
  Filled 2013-03-15 (×2): qty 2.5

## 2013-03-15 MED ORDER — BENAZEPRIL-HYDROCHLOROTHIAZIDE 10-12.5 MG PO TABS: 1.0000 | ORAL_TABLET | Freq: Every morning | ORAL | Status: DC

## 2013-03-15 MED ORDER — METHYLPREDNISOLONE SODIUM SUCC 125 MG IJ SOLR
125.0000 mg | Freq: Once | INTRAMUSCULAR | Status: AC
  Administered 2013-03-15: 125 mg via INTRAVENOUS
  Filled 2013-03-15: qty 2

## 2013-03-15 MED ORDER — OXYCODONE HCL 5 MG PO TABS: 5.0000 mg | ORAL_TABLET | ORAL | Status: DC | PRN

## 2013-03-15 MED ORDER — METHYLPREDNISOLONE SODIUM SUCC 125 MG IJ SOLR
60.0000 mg | Freq: Four times a day (QID) | INTRAMUSCULAR | Status: DC
  Administered 2013-03-15 – 2013-03-17 (×8): 60 mg via INTRAVENOUS
  Filled 2013-03-15 (×12): qty 0.96

## 2013-03-15 MED ORDER — PREGABALIN 50 MG PO CAPS
150.0000 mg | ORAL_CAPSULE | Freq: Every morning | ORAL | Status: DC
  Administered 2013-03-15 – 2013-03-17 (×3): 150 mg via ORAL
  Filled 2013-03-15 (×2): qty 3
  Filled 2013-03-15: qty 1
  Filled 2013-03-15: qty 3

## 2013-03-15 MED ORDER — PREGABALIN 50 MG PO CAPS
75.0000 mg | ORAL_CAPSULE | Freq: Every evening | ORAL | Status: DC
  Administered 2013-03-15 – 2013-03-16 (×2): 75 mg via ORAL
  Filled 2013-03-15: qty 1
  Filled 2013-03-15: qty 2
  Filled 2013-03-15: qty 1

## 2013-03-15 MED ORDER — ENSURE COMPLETE PO LIQD: 237.0000 mL | Freq: Two times a day (BID) | ORAL | Status: DC | PRN

## 2013-03-15 MED ORDER — TAMSULOSIN HCL 0.4 MG PO CAPS
0.4000 mg | ORAL_CAPSULE | Freq: Every evening | ORAL | Status: DC
  Administered 2013-03-15 – 2013-03-17 (×3): 0.4 mg via ORAL
  Filled 2013-03-15 (×3): qty 1

## 2013-03-15 MED ORDER — POTASSIUM CHLORIDE CRYS ER 20 MEQ PO TBCR
40.0000 meq | EXTENDED_RELEASE_TABLET | Freq: Two times a day (BID) | ORAL | Status: AC
  Administered 2013-03-15 (×2): 40 meq via ORAL
  Filled 2013-03-15 (×2): qty 2

## 2013-03-15 MED ORDER — PREGABALIN 75 MG PO CAPS: 150.0000 mg | ORAL_CAPSULE | Freq: Every morning | ORAL | Status: DC

## 2013-03-15 MED ORDER — ADULT MULTIVITAMIN W/MINERALS CH
1.0000 | ORAL_TABLET | Freq: Every day | ORAL | Status: DC
  Administered 2013-03-15 – 2013-03-17 (×3): 1 via ORAL
  Filled 2013-03-15 (×3): qty 1

## 2013-03-15 MED ORDER — IPRATROPIUM BROMIDE 0.02 % IN SOLN
0.5000 mg | Freq: Once | RESPIRATORY_TRACT | Status: AC
  Administered 2013-03-15: 0.5 mg via RESPIRATORY_TRACT
  Filled 2013-03-15: qty 2.5

## 2013-03-15 MED ORDER — TRAMADOL HCL 50 MG PO TABS
50.0000 mg | ORAL_TABLET | Freq: Four times a day (QID) | ORAL | Status: DC | PRN
Start: 2013-03-15 — End: 2013-03-17

## 2013-03-15 MED ORDER — ASPIRIN EC 325 MG PO TBEC
325.0000 mg | DELAYED_RELEASE_TABLET | Freq: Every evening | ORAL | Status: DC
  Administered 2013-03-15 – 2013-03-17 (×3): 325 mg via ORAL
  Filled 2013-03-15 (×3): qty 1

## 2013-03-15 MED ORDER — AMOXICILLIN-POT CLAVULANATE 875-125 MG PO TABS
1.0000 | ORAL_TABLET | Freq: Two times a day (BID) | ORAL | Status: DC
  Administered 2013-03-15 – 2013-03-17 (×5): 1 via ORAL
  Filled 2013-03-15 (×6): qty 1

## 2013-03-15 MED ORDER — TECHNETIUM TO 99M ALBUMIN AGGREGATED
5.7000 | Freq: Once | INTRAVENOUS | Status: AC | PRN
  Administered 2013-03-15: 5.7 via INTRAVENOUS

## 2013-03-15 MED ORDER — ALBUTEROL SULFATE (5 MG/ML) 0.5% IN NEBU
5.0000 mg | INHALATION_SOLUTION | Freq: Once | RESPIRATORY_TRACT | Status: AC
  Administered 2013-03-15: 5 mg via RESPIRATORY_TRACT
  Filled 2013-03-15: qty 0.5

## 2013-03-15 MED ORDER — ALBUTEROL SULFATE (5 MG/ML) 0.5% IN NEBU
5.0000 mg | INHALATION_SOLUTION | Freq: Once | RESPIRATORY_TRACT | Status: AC
  Administered 2013-03-15: 5 mg via RESPIRATORY_TRACT
  Filled 2013-03-15: qty 1

## 2013-03-15 MED ORDER — HYDROCHLOROTHIAZIDE 12.5 MG PO CAPS
12.5000 mg | ORAL_CAPSULE | Freq: Every day | ORAL | Status: DC
  Administered 2013-03-15 – 2013-03-17 (×3): 12.5 mg via ORAL
  Filled 2013-03-15 (×3): qty 1

## 2013-03-15 NOTE — H&P (Signed)
[facility] History and Physical  MILAN CLARE WUJ:[id number] DOB: 1941-08-10 DOA: 03/15/2013  Referring physician: Dr Patria Mane. PCP: No primary provider on file.    Chief Complaint: shortness of breath since yesterday.   HPI: Hannah Conrad is a 72 y.o. female with h/o hypertension, CKD stage 2 , fibromyalgia, past smoker, came in for worsening sob since yesterday associated with cough with productive sputum, low grade temp,. On arrival to ED, she was diffusely wheezing, required nasal oxygen. She was given IV steroids and nebs and her breathing improved. She is being admitted to medical service for management of COPD exacerbation.   Review of Systems: The patient denies anorexia, weight loss,, vision loss, decreased hearing, hoarseness,  syncope, peripheral edema, balance deficits, hemoptysis, abdominal pain, melena, hematochezia, severe indigestion/heartburn, hematuria, incontinence, genital sores, muscle weakness, suspicious skin lesions, transient blindness, difficulty walking, depression, unusual weight change, abnormal bleeding, enlarged lymph nodes, angioedema, and breast masses.    Past Medical History  Diagnosis Date  . Hypertension   . Fibromyalgia   . Spinal stenosis   . Kidney disease    Past Surgical History  Procedure Laterality Date  . Colon surgery    . Cholecystectomy    . Rectovaginal fistula closure     Social History:  reports that she quit smoking about 4 years ago. She has never used smokeless tobacco. She reports that she does not drink alcohol or use illicit drugs.  where does patient live--home,  Allergies  Allergen Reactions  . Ciprofloxacin Anaphylaxis  . Septra (Sulfamethoxazole W-Trimethoprim) Anaphylaxis    History reviewed. No pertinent family history.   Prior to Admission medications   Medication Sig Start Date End Date Taking? Authorizing Provider  acidophilus (RISAQUAD) CAPS Take 1 capsule by mouth every morning.   Yes Historical  Provider, MD  aspirin EC 325 MG tablet Take 325 mg by mouth every evening.   Yes Historical Provider, MD  benazepril-hydrochlorthiazide (LOTENSIN HCT) 10-12.5 MG per tablet Take 1 tablet by mouth every morning.   Yes Historical Provider, MD  Borage, Borago officinalis, (BORAGE 1000 PO) Take 1 capsule by mouth every morning.   Yes Historical Provider, MD  calcium carbonate (OS-CAL - DOSED IN MG OF ELEMENTAL CALCIUM) 1250 MG tablet Take 1 tablet by mouth every morning.   Yes Historical Provider, MD  cholecalciferol (VITAMIN D) 1000 UNITS tablet Take 1,000 Units by mouth daily.   Yes Historical Provider, MD  estradiol (ESTRACE) 0.5 MG tablet Take 0.5 mg by mouth every evening.   Yes Historical Provider, MD  felodipine (PLENDIL) 10 MG 24 hr tablet Take 10 mg by mouth every evening.   Yes Historical Provider, MD  MAGNESIUM PO Take 1 tablet by mouth every morning.   Yes Historical Provider, MD  meloxicam (MOBIC) 15 MG tablet Take 15 mg by mouth every morning.   Yes Historical Provider, MD  Multiple Vitamin (MULTIVITAMIN WITH MINERALS) TABS Take 1 tablet by mouth every morning.   Yes Historical Provider, MD  polycarbophil (FIBERCON) 625 MG tablet Take 625 mg by mouth every morning.   Yes Historical Provider, MD  pregabalin (LYRICA) 75 MG capsule Take 75-150 mg by mouth 2 (two) times daily. Take 150mg  in the morning and 75mg  at night   Yes Historical Provider, MD  tamsulosin (FLOMAX) 0.4 MG CAPS Take 0.4 mg by mouth every evening.   Yes Historical Provider, MD  traMADol (ULTRAM) 50 MG tablet Take 50 mg by mouth every 6 (six) hours as needed for pain.  Yes Historical Provider, MD  vitamin E 100 UNIT capsule Take 100 Units by mouth every morning.   Yes Historical Provider, MD   Physical Exam: Filed Vitals:   03/15/13 0600 03/15/13 0634 03/15/13 0800 03/15/13 0851  BP: 141/124  140/62 136/69  Pulse: 75  78 99  Temp:  100 F (37.8 C)  99.7 F (37.6 C)  TempSrc:  Rectal  Oral  Resp: 26  17 20   SpO2:  100%  100% 91%    Constitutional: Vital signs reviewed.  Patient is a well-developed and well-nourished  in no acute distress and cooperative with exam. Alert and oriented x3.  Head: Normocephalic and atraumatic Mouth: no erythema or exudates, MMM Eyes: PERRL, EOMI, conjunctivae normal, No scleral icterus.  Neck: Supple, Trachea midline normal ROM, No JVD, mass, thyromegaly, or carotid bruit present.  Cardiovascular: RRR, S1 normal, S2 normal, no MRG, pulses symmetric and intact bilaterally Pulmonary/Chest: decreased air entry bilateral, scattered wheezing.  Abdominal: Soft. Non-tender, non-distended, bowel sounds are normal, no masses, organomegaly, or guarding present.  Musculoskeletal: No joint deformities, erythema, or stiffness, ROM full and no nontender Neurological: A&O x3, Strength is normal and symmetric bilaterally,no focal motor deficit, sensory intact to light touch bilaterally.  Skin: Warm, dry and intact. No rash, cyanosis, or clubbing.  Psychiatric: Normal mood and affect. speech and behavior is normal.  Labs on Admission:  Basic Metabolic Panel:  Recent Labs Lab 03/15/13 0635  NA 136  K 3.4*  CL 101  CO2 21  GLUCOSE 101*  BUN 24*  CREATININE 1.11*  CALCIUM 8.8   Liver Function Tests: No results found for this basename: AST, ALT, ALKPHOS, BILITOT, PROT, ALBUMIN,  in the last 168 hours No results found for this basename: LIPASE, AMYLASE,  in the last 168 hours No results found for this basename: AMMONIA,  in the last 168 hours CBC:  Recent Labs Lab 03/15/13 0635  WBC 6.3  NEUTROABS 4.9  HGB 13.1  HCT 39.0  MCV 90.5  PLT 178   Cardiac Enzymes: No results found for this basename: CKTOTAL, CKMB, CKMBINDEX, TROPONINI,  in the last 168 hours  BNP (last 3 results) No results found for this basename: PROBNP,  in the last 8760 hours CBG: No results found for this basename: GLUCAP,  in the last 168 hours  Radiological Exams on Admission: Dg Chest 2  View  03/15/2013  *RADIOLOGY REPORT*  Clinical Data: Sudden onset of shortness of breath.  CHEST - 2 VIEW  Comparison: Chest radiograph performed 11/02/2009  Findings: The lungs are well-aerated and clear.  There is no evidence of focal opacification, pleural effusion or pneumothorax.  The heart is normal in size; the mediastinal contour is within normal limits.  No acute osseous abnormalities are seen.  The esophagus appears to be filled with air.  Clips are noted within the right upper quadrant, reflecting prior cholecystectomy.  IMPRESSION: No acute cardiopulmonary process seen.   Original Report Authenticated By: Tonia Ghent, M.D.     EKG: pending.   Assessment/Plan Active Problems: 1. COPD exaerbation with bronchitis: - admit to med surg - start her on IV steroids, nebs and oxygen as needed.  - cough medications, augmentin for bronchitis.  - PT eval.   2. Hypertension; controlled.  - resume home medications.   3. Hypokalemia: replete as needed.   4. Stage 1 to2 CKD: repeat labs today. Chronic and stable.   5. Elevated d dimer/ sob/ recent air travel: will order NM scan for evaluation of  PE.   6. DVT prophylaxis.   Code Status: full code Family Communication: none at bedside.  Disposition Plan: home when stable.   Time spent: 80 min  Charels Stambaugh [facility] Pager [phone]  If 7PM-7AM, please contact night-coverage [email or web address] Password [facility] 03/15/2013, 8:58 AM

## 2013-03-15 NOTE — ED Provider Notes (Signed)
History     CSN: [id number]  Arrival date & time 03/15/13  0346   First MD Initiated Contact with Patient 03/15/13 [phone]      Chief Complaint  Patient presents with  . Shortness of Breath  . Wheezing     The history is provided by the patient.   she reports worsening shortness of breath over the past 24 hours.  She has a long time smoking history but no longer smokes cigarettes.  She has no known history of asthma or COPD.  She reports minimal exertion causes her significant shortness of breath.  She is now to be wheezing per EMS EMS gave albuterol in route.  The patient states she feels better at this time.  She did just fly back from [place] 2 days ago and states that she developed some cough today she returned.  She had fever yesterday.  Her cough is productive.  She denies chest pain or pleuritic nature for pain.  No leg swelling.  No history of DVT or pulmonary embolism.  Her symptoms are mild to moderate in severity and improving after albuterol.    Past Medical History  Diagnosis Date  . Hypertension   . Fibromyalgia   . Spinal stenosis   . Kidney disease     Past Surgical History  Procedure Laterality Date  . Colon surgery    . Cholecystectomy    . Rectovaginal fistula closure      History reviewed. No pertinent family history.  History  Substance Use Topics  . Smoking status: Former Smoker    Quit date: 12/15/2008  . Smokeless tobacco: Never Used  . Alcohol Use: No    OB History   Grav Para Term Preterm Abortions TAB SAB Ect Mult Living                  Review of Systems  All other systems reviewed and are negative.    Allergies  Ciprofloxacin and Septra  Home Medications   Current Outpatient Rx  Name  Route  Sig  Dispense  Refill  . acidophilus (RISAQUAD) CAPS   Oral   Take 1 capsule by mouth every morning.         Marland Kitchen aspirin EC 325 MG tablet   Oral   Take 325 mg by mouth every evening.         . benazepril-hydrochlorthiazide (LOTENSIN  HCT) 10-12.5 MG per tablet   Oral   Take 1 tablet by mouth every morning.         Florian Buff, Borago officinalis, (BORAGE 1000 PO)   Oral   Take 1 capsule by mouth every morning.         . calcium carbonate (OS-CAL - DOSED IN MG OF ELEMENTAL CALCIUM) 1250 MG tablet   Oral   Take 1 tablet by mouth every morning.         . cholecalciferol (VITAMIN D) 1000 UNITS tablet   Oral   Take 1,000 Units by mouth daily.         Marland Kitchen estradiol (ESTRACE) 0.5 MG tablet   Oral   Take 0.5 mg by mouth every evening.         . felodipine (PLENDIL) 10 MG 24 hr tablet   Oral   Take 10 mg by mouth every evening.         Marland Kitchen MAGNESIUM PO   Oral   Take 1 tablet by mouth every morning.         Marland Kitchen  meloxicam (MOBIC) 15 MG tablet   Oral   Take 15 mg by mouth every morning.         . Multiple Vitamin (MULTIVITAMIN WITH MINERALS) TABS   Oral   Take 1 tablet by mouth every morning.         . polycarbophil (FIBERCON) 625 MG tablet   Oral   Take 625 mg by mouth every morning.         . pregabalin (LYRICA) 75 MG capsule   Oral   Take 75-150 mg by mouth 2 (two) times daily. Take 150mg  in the morning and 75mg  at night         . tamsulosin (FLOMAX) 0.4 MG CAPS   Oral   Take 0.4 mg by mouth every evening.         . traMADol (ULTRAM) 50 MG tablet   Oral   Take 50 mg by mouth every 6 (six) hours as needed for pain.         . vitamin E 100 UNIT capsule   Oral   Take 100 Units by mouth every morning.           BP 140/62  Pulse 84  Temp(Src) 98.7 F (37.1 C) (Oral)  Resp 27  SpO2 100%  Physical Exam  Nursing note and vitals reviewed. Constitutional: She is oriented to person, place, and time. She appears well-developed and well-nourished. No distress.  HENT:  Head: Normocephalic and atraumatic.  Eyes: EOM are normal.  Neck: Normal range of motion.  Cardiovascular: Normal rate, regular rhythm and normal heart sounds.   Pulmonary/Chest: She is in respiratory distress.  She has wheezes.  Abdominal: Soft. She exhibits no distension. There is no tenderness.  Musculoskeletal: Normal range of motion.  Neurological: She is alert and oriented to person, place, and time.  Skin: Skin is warm and dry.  Psychiatric: She has a normal mood and affect. Judgment normal.    ED Course  Procedures   Labs Reviewed - No data to display Dg Chest 2 View  03/15/2013  *RADIOLOGY REPORT*  Clinical Data: Sudden onset of shortness of breath.  CHEST - 2 VIEW  Comparison: Chest radiograph performed 11/02/2009  Findings: The lungs are well-aerated and clear.  There is no evidence of focal opacification, pleural effusion or pneumothorax.  The heart is normal in size; the mediastinal contour is within normal limits.  No acute osseous abnormalities are seen.  The esophagus appears to be filled with air.  Clips are noted within the right upper quadrant, reflecting prior cholecystectomy.  IMPRESSION: No acute cardiopulmonary process seen.   Original Report Authenticated By: Tonia Ghent, M.D.    I personally reviewed the imaging tests through PACS system I reviewed available ER/hospitalization records through the EMR   1. COPD exacerbation   2. Bronchitis       MDM  The patient continues to do better at this time after additional albuterol and Atrovent as well as Solu-Medrol.  Chest x-ray demonstrates no evidence of pneumonia.  Her d-dimer is 0.51, however my suspicion for pulmonary embolism is low and therefore this will not be worked up further.  She has bilateral wheezing and this is more consistent with COPD exacerbation.  The patient will be admitted for ongoing shortness of breath.  I suspect this is a bronchitis that tipped her breathing over as she's never had a true COPD exacerbation before        Lyanne Co, MD 03/15/13 [phone]

## 2013-03-15 NOTE — ED Notes (Signed)
ZOX:[id number]<AV> Expected date:03/15/13<BR> Expected time: 3:22 AM<BR> Means of arrival:Ambulance<BR> Comments:<BR> sob

## 2013-03-15 NOTE — ED Notes (Signed)
Patient taken off O2 by MD campos. Sats eventually dropped down to 91. Another breathing treatment given.

## 2013-03-15 NOTE — ED Notes (Signed)
Pt called ems this am d/t sob. ems stated per arrival and during transport pt started to have bil wheezing noted. Pt was given abluetolol neb 5 x 1 per ems. Pt states she just came back from out of town 2 days ago. Pt took 9 hour flight and pt also states that while she was gone carpets were cleaned. Pt denies any hx of copd or asthma. Pt states she used to smoke for years.

## 2013-03-15 NOTE — ED Notes (Signed)
MD at bedside. 

## 2013-03-15 NOTE — Progress Notes (Signed)
INITIAL NUTRITION ASSESSMENT  DOCUMENTATION CODES Per approved criteria  -Not Applicable   INTERVENTION: Provide Ensure BID in between meals Provide Multivitamin with minerals daily  NUTRITION DIAGNOSIS: Inadequate oral intake related to poor appetite as evidenced by pt's report of not eating, 3% wt loss, and po intake 25% of one meal per nursing note.  Goal: Pt to meet >/= 90% of their estimated nutrition needs  Monitor:  PO intake Wt Labs  Reason for Assessment: MST  72 y.o. female  Admitting Dx: COPD exacerbation  ASSESSMENT: 72 y.o. female with h/o hypertension, CKD stage 2 , fibromyalgia, past smoker, came in for worsening sob since yesterday associated with cough with productive sputum, low grade temp,. On arrival to ED, she was diffusely wheezing, required nasal oxygen. She was given IV steroids and nebs and her breathing improved. She is being admitted to medical service for management of COPD exacerbation.   Pt asleep at time of visit, RD was able to awake pt but, pt fell asleep again and RD unable to complete assessment. Pt states that she usually weighs 148 lbs, did not eat anything today, and was not eating PTA due to poor appetite. Unable to confirm how long pt has not been eating for. Per nursing note pt consumed 25% of one meal today.   Height: Ht Readings from Last 1 Encounters:  03/15/13 5\' 2"  (1.575 m)    Weight: Wt Readings from Last 1 Encounters:  03/15/13 143 lb 15.4 oz (65.3 kg)    Ideal Body Weight: 110 lbs  % Ideal Body Weight: 130%  Wt Readings from Last 10 Encounters:  03/15/13 143 lb 15.4 oz (65.3 kg)    Usual Body Weight: 148 lbs  % Usual Body Weight: 97%  BMI:  Body mass index is 26.32 kg/(m^2).  Estimated Nutritional Needs: Kcal: 1570-1830 Protein: 98-111 grams Fluid: 1.9-2.1 L  Skin: Nothing noted in nursing notes at this time  Diet Order: Cardiac  EDUCATION NEEDS: -Education not appropriate at this  time   Intake/Output Summary (Last 24 hours) at 03/15/13 1536 Last data filed at 03/15/13 1452  Gross per 24 hour  Intake    240 ml  Output      0 ml  Net    240 ml    Last BM: PTA  Labs:   Recent Labs Lab 03/15/13 0635 03/15/13 1310  NA 136  --   K 3.4*  --   CL 101  --   CO2 21  --   BUN 24*  --   CREATININE 1.11* 1.00  CALCIUM 8.8  --   GLUCOSE 101*  --     CBG (last 3)  No results found for this basename: GLUCAP,  in the last 72 hours  Scheduled Meds: . amoxicillin-clavulanate  1 tablet Oral Q12H  . aspirin EC  325 mg Oral QPM  . benazepril  10 mg Oral Daily  . enoxaparin (LOVENOX) injection  40 mg Subcutaneous Q24H  . felodipine  10 mg Oral QPM  . hydrochlorothiazide  12.5 mg Oral Daily  . methylPREDNISolone (SOLU-MEDROL) injection  60 mg Intravenous Q6H  . potassium chloride  40 mEq Oral BID  . pregabalin  150 mg Oral q morning - 10a  . pregabalin  75 mg Oral QPM  . tamsulosin  0.4 mg Oral QPM    Continuous Infusions:   Past Medical History  Diagnosis Date  . Hypertension   . Fibromyalgia   . Spinal stenosis   . Kidney disease   .  SOB (shortness of breath)     Past Surgical History  Procedure Laterality Date  . Colon surgery    . Cholecystectomy    . Rectovaginal fistula closure      Ian Malkin RD, LDN Inpatient Clinical Dietitian Pager: [phone] After Hours Pager: [phone]

## 2013-03-15 NOTE — ED Notes (Addendum)
Complained of muscle soreness from coughing. Pain 7/10

## 2013-03-16 DIAGNOSIS — R197 Diarrhea, unspecified: Secondary | ICD-10-CM

## 2013-03-16 LAB — BASIC METABOLIC PANEL
BUN: 28 mg/dL — ABNORMAL HIGH (ref 6–23)
CO2: 26 mEq/L (ref 19–32)
Calcium: 8.8 mg/dL (ref 8.4–10.5)
Chloride: 103 mEq/L (ref 96–112)
Creatinine, Ser: 1.05 mg/dL (ref 0.50–1.10)
Glucose, Bld: 133 mg/dL — ABNORMAL HIGH (ref 70–99)

## 2013-03-16 LAB — CBC
HCT: 37 % (ref 36.0–46.0)
MCH: 29.7 pg (ref 26.0–34.0)
MCHC: 33.2 g/dL (ref 30.0–36.0)
MCV: 89.4 fL (ref 78.0–100.0)
Platelets: 173 10*3/uL (ref 150–400)
RDW: 14.2 % (ref 11.5–15.5)

## 2013-03-16 MED ORDER — IPRATROPIUM BROMIDE 0.02 % IN SOLN
0.5000 mg | Freq: Four times a day (QID) | RESPIRATORY_TRACT | Status: DC
  Administered 2013-03-16 – 2013-03-17 (×5): 0.5 mg via RESPIRATORY_TRACT
  Filled 2013-03-16 (×6): qty 2.5

## 2013-03-16 MED ORDER — PHENOL 1.4 % MT LIQD
1.0000 | OROMUCOSAL | Status: DC | PRN
  Filled 2013-03-16: qty 177

## 2013-03-16 MED ORDER — LEVALBUTEROL HCL 0.63 MG/3ML IN NEBU
0.6300 mg | INHALATION_SOLUTION | Freq: Four times a day (QID) | RESPIRATORY_TRACT | Status: DC
  Administered 2013-03-16 – 2013-03-17 (×5): 0.63 mg via RESPIRATORY_TRACT
  Filled 2013-03-16 (×8): qty 3

## 2013-03-16 MED ORDER — SACCHAROMYCES BOULARDII 250 MG PO CAPS
250.0000 mg | ORAL_CAPSULE | Freq: Two times a day (BID) | ORAL | Status: DC
  Administered 2013-03-16 – 2013-03-17 (×3): 250 mg via ORAL
  Filled 2013-03-16 (×4): qty 1

## 2013-03-16 MED ORDER — IPRATROPIUM BROMIDE 0.02 % IN SOLN: 0.5000 mg | Freq: Four times a day (QID) | RESPIRATORY_TRACT | Status: DC

## 2013-03-16 MED ORDER — LORATADINE 10 MG PO TABS
10.0000 mg | ORAL_TABLET | Freq: Every day | ORAL | Status: DC
  Administered 2013-03-16 – 2013-03-17 (×2): 10 mg via ORAL
  Filled 2013-03-16 (×2): qty 1

## 2013-03-16 MED ORDER — BENZONATATE 100 MG PO CAPS
200.0000 mg | ORAL_CAPSULE | Freq: Three times a day (TID) | ORAL | Status: DC
  Administered 2013-03-16 – 2013-03-17 (×4): 200 mg via ORAL
  Filled 2013-03-16 (×6): qty 2

## 2013-03-16 MED ORDER — LEVALBUTEROL HCL 0.63 MG/3ML IN NEBU
0.6300 mg | INHALATION_SOLUTION | RESPIRATORY_TRACT | Status: DC | PRN
  Filled 2013-03-16: qty 3

## 2013-03-16 MED ORDER — DM-GUAIFENESIN ER 30-600 MG PO TB12
1.0000 | ORAL_TABLET | Freq: Two times a day (BID) | ORAL | Status: DC
  Administered 2013-03-16 – 2013-03-17 (×3): 1 via ORAL
  Filled 2013-03-16 (×4): qty 1

## 2013-03-16 MED ORDER — FLUTICASONE PROPIONATE 50 MCG/ACT NA SUSP
2.0000 | Freq: Every day | NASAL | Status: DC
  Administered 2013-03-16 – 2013-03-17 (×2): 2 via NASAL
  Filled 2013-03-16: qty 16

## 2013-03-16 MED ORDER — DIPHENOXYLATE-ATROPINE 2.5-0.025 MG PO TABS
1.0000 | ORAL_TABLET | Freq: Four times a day (QID) | ORAL | Status: DC
  Administered 2013-03-16 – 2013-03-17 (×6): 1 via ORAL
  Filled 2013-03-16 (×6): qty 1

## 2013-03-16 MED ORDER — LEVALBUTEROL HCL 0.63 MG/3ML IN NEBU
0.6300 mg | INHALATION_SOLUTION | Freq: Four times a day (QID) | RESPIRATORY_TRACT | Status: DC
  Filled 2013-03-16 (×4): qty 3

## 2013-03-16 NOTE — Evaluation (Signed)
Physical Therapy Evaluation Patient Details Name: Hannah Conrad MRN: [id number] DOB: 10/03/1941 Today's Date: 03/16/2013 Time: 4782-9562 PT Time Calculation (min): 22 min  PT Assessment / Plan / Recommendation Clinical Impression  Pt admitted with extreme SOB with minimal exertion 2* COPD exacerbation.  Pts functional mobility is currently limited by continued (but improving) SOB, balance deficits with increased fatigue and ltd endurance.  Pt hopes to return to home with intermittant assist of family but is reluctant to use of RW for assistance stating She has no room to manouver and has just been utilizing furniture for support as needed.    PT Assessment  Patient needs continued PT services    Follow Up Recommendations  Home health PT (Dependent on acute stay progress)    Does the patient have the potential to tolerate intense rehabilitation      Barriers to Discharge Decreased caregiver support Pt states dtr and dtr in law can check in on her    Equipment Recommendations  None recommended by PT (Pt declines RW for home use)    Recommendations for Other Services OT consult   Frequency Min 3X/week    Precautions / Restrictions Precautions Precautions: Fall Restrictions Weight Bearing Restrictions: No   Pertinent Vitals/Pain No c/o pain.  Desat to 85 on 2L during ambulation but with rapid recovery to 94% at rest      Mobility  Bed Mobility Bed Mobility: Supine to Sit;Sit to Supine Supine to Sit: 6: Modified independent (Device/Increase time) Sit to Supine: 6: Modified independent (Device/Increase time) Transfers Transfers: Sit to Stand;Stand to Sit Sit to Stand: 4: Min assist Stand to Sit: 4: Min assist Details for Transfer Assistance: cues for use of UEs to self assist and for safe positioning for transition Ambulation/Gait Ambulation/Gait Assistance: 4: Min assist Ambulation Distance (Feet): 200 Feet Assistive device: Rolling walker Ambulation/Gait Assistance  Details: min assist for balance with cues for posture and position from RW Gait Pattern: Step-to pattern;Step-through pattern;Trunk flexed General Gait Details: Cues to decrease pace with multiple short standing rests required to complete task - PT on 2L O2 with sats at reset 95% and desat to low of 85% with ambulation Stairs: No    Exercises     PT Diagnosis: Difficulty walking  PT Problem List: Decreased activity tolerance;Decreased balance;Decreased mobility;Decreased knowledge of use of DME PT Treatment Interventions: DME instruction;Gait training;Stair training;Functional mobility training;Therapeutic activities;Therapeutic exercise;Balance training;Patient/family education   PT Goals Acute Rehab PT Goals PT Goal Formulation: With patient Time For Goal Achievement: 03/27/13 Potential to Achieve Goals: Good Pt will go Supine/Side to Sit: Independently PT Goal: Supine/Side to Sit - Progress: Goal set today Pt will go Sit to Supine/Side: Independently PT Goal: Sit to Supine/Side - Progress: Goal set today Pt will go Sit to Stand: with modified independence PT Goal: Sit to Stand - Progress: Goal set today Pt will go Stand to Sit: with modified independence PT Goal: Stand to Sit - Progress: Goal set today Pt will Ambulate: >150 feet;with supervision;with least restrictive assistive device PT Goal: Ambulate - Progress: Goal set today Pt will Go Up / Down Stairs: 1-2 stairs;with rail(s);with min assist PT Goal: Up/Down Stairs - Progress: Goal set today  Visit Information  Last PT Received On: 03/16/13 Assistance Needed: +1    Subjective Data  Subjective: I'm doing better now but I know I couldn't walk like this without the walker Patient Stated Goal: Home to resume previous lifestyle   Prior Functioning  Home Living Lives With: Alone Available  Help at Discharge: Family Type of Home: House Home Access: Stairs to enter [employer] of Steps: 2 Entrance Stairs-Rails:  Right Home Layout: Able to live on main level with bedroom/bathroom Prior Function Level of Independence: Independent Able to Take Stairs?: Yes Driving: Yes Comments: Had just returned from trip to [place] Communication Communication: No difficulties    Cognition  Cognition Arousal/Alertness: Awake/alert Behavior During Therapy: WFL for tasks assessed/performed Overall Cognitive Status: Within Functional Limits for tasks assessed    Extremity/Trunk Assessment Right Upper Extremity Assessment RUE ROM/Strength/Tone: [facility] for tasks assessed Left Upper Extremity Assessment LUE ROM/Strength/Tone: WFL for tasks assessed Right Lower Extremity Assessment RLE ROM/Strength/Tone: [facility] for tasks assessed Left Lower Extremity Assessment LLE ROM/Strength/Tone: WFL for tasks assessed Trunk Assessment Trunk Assessment: Kyphotic   Balance    End of Session PT - End of Session Equipment Utilized During Treatment: Gait belt;Oxygen Activity Tolerance: Patient tolerated treatment well;Patient limited by fatigue Patient left: in bed;with call bell/phone within reach;with nursing in room Nurse Communication: Mobility status  GP     Kayton Ripp 03/16/2013, 1:05 PM

## 2013-03-16 NOTE — Progress Notes (Signed)
[facility] PROGRESS NOTE  Hannah Conrad WUJ:[id number] DOB: June 27, 1941 DOA: 03/15/2013 PCP: No primary provider on file.  Assessment/Plan: 1. COPD exaerbation with bronchitis:  - continue current steroids, add scheduled nebs and continue oxygen as needed.  - start on cough medications, continue augmentin for bronchitis.  - OOB to chair, await PT eval.  2. Hypertension; controlled.  - continue home medications.  3. Hypokalemia: Likely secondary to GI losses, resolved 4. Stage 1 to2 CKD: . Chronic and stable. -Creatinine normalized the  5. Elevated d dimer/ sob/ recent air travel:  - VQ scan negative for PE 6.Diarrhea -C. difficile negative, will start antidiarrheal agent and follow  Code Status: full Family Communication: No family present at the Disposition Plan: To home when medically stable   Consultants:  none  Procedures:  none  Antibiotics:  augmentin  HPI/Subjective: Still with shortness of breath, also reports diarrhea since admission. He denies chest pain.  Objective: Filed Vitals:   03/15/13 1451 03/15/13 2200 03/15/13 2233 03/16/13 0532  BP: 121/81 115/77  116/76  Pulse: 68 73 79 62  Temp: 98 F (36.7 C) 98.7 F (37.1 C)  97.6 F (36.4 C)  TempSrc: Oral Oral  Oral  Resp: 20 20 20 18   Height:      Weight:      SpO2: 98% 94% 97% 96%    Intake/Output Summary (Last 24 hours) at 03/16/13 1010 Last data filed at 03/15/13 1452  Gross per 24 hour  Intake    240 ml  Output      0 ml  Net    240 ml   Filed Weights   03/15/13 0920  Weight: 65.3 kg (143 lb 15.4 oz)    Exam:   General: Elderly female alert and oriented x3 in no apparent distress  Cardiovascular: Regular rate and rhythm, S1-S2  Respiratory: Bilateral expiratory wheezes, no crackles.  Abdomen: Soft, bowel sounds present nontender and nondistended no organomegaly and no masses palpable  Extremities: No cyanosis and no edema  Data Reviewed: Basic Metabolic  Panel:  Recent Labs Lab 03/15/13 0635 03/15/13 1310 03/16/13 0520  NA 136  --  135  K 3.4*  --  4.8  CL 101  --  103  CO2 21  --  26  GLUCOSE 101*  --  133*  BUN 24*  --  28*  CREATININE 1.11* 1.00 1.05  CALCIUM 8.8  --  8.8   Liver Function Tests: No results found for this basename: AST, ALT, ALKPHOS, BILITOT, PROT, ALBUMIN,  in the last 168 hours No results found for this basename: LIPASE, AMYLASE,  in the last 168 hours No results found for this basename: AMMONIA,  in the last 168 hours CBC:  Recent Labs Lab 03/15/13 0635 03/15/13 1310 03/16/13 0520  WBC 6.3 4.9 7.8  NEUTROABS 4.9  --   --   HGB 13.1 12.8 12.3  HCT 39.0 37.7 37.0  MCV 90.5 90.8 89.4  PLT 178 198 173   Cardiac Enzymes: No results found for this basename: CKTOTAL, CKMB, CKMBINDEX, TROPONINI,  in the last 168 hours BNP (last 3 results)  Recent Labs  03/15/13 1310  PROBNP 256.8*   CBG: No results found for this basename: GLUCAP,  in the last 168 hours  No results found for this or any previous visit (from the past 240 hour(s)).   Studies: Dg Chest 2 View  03/15/2013  *RADIOLOGY REPORT*  Clinical Data: Sudden onset of shortness of breath.  CHEST - 2  VIEW  Comparison: Chest radiograph performed 11/02/2009  Findings: The lungs are well-aerated and clear.  There is no evidence of focal opacification, pleural effusion or pneumothorax.  The heart is normal in size; the mediastinal contour is within normal limits.  No acute osseous abnormalities are seen.  The esophagus appears to be filled with air.  Clips are noted within the right upper quadrant, reflecting prior cholecystectomy.  IMPRESSION: No acute cardiopulmonary process seen.   Original Report Authenticated By: Tonia Ghent, M.D.    Nm Pulmonary Perf And Vent  03/15/2013  *RADIOLOGY REPORT*  Clinical Data: Shortness of breath.  Recent air travel.  Elevated D- dimer.  NM PULMONARY VENTILATION AND PERFUSION SCAN  Radiopharmaceutical: 5.7MILLI CURIE  MAA TECHNETIUM TO 15M ALBUMIN AGGREGATED , 39.1 mCi of technetium 49m DTPA.  Comparison: Chest radiograph from 03/15/2013  Findings: There is a heterogeneous distribution of the aerosolized radiopharmaceutical on the ventilation portion of the examination. This may reflect turbulent flow within the large airways perhaps secondary to obstructive pulmonary disease.  On the perfusion portion of the examination there are no suspicious medium or large large size segmental perfusion defects.  IMPRESSION:  1.  Low probability for acute pulmonary embolus. 2.  COPD.   Original Report Authenticated By: Signa Kell, M.D.     Scheduled Meds: . amoxicillin-clavulanate  1 tablet Oral Q12H  . aspirin EC  325 mg Oral QPM  . benazepril  10 mg Oral Daily  . enoxaparin (LOVENOX) injection  40 mg Subcutaneous Q24H  . felodipine  10 mg Oral QPM  . fluticasone  2 spray Each Nare Daily  . hydrochlorothiazide  12.5 mg Oral Daily  . ipratropium  0.5 mg Nebulization Q6H  . levalbuterol  0.63 mg Nebulization Q6H  . loratadine  10 mg Oral Daily  . methylPREDNISolone (SOLU-MEDROL) injection  60 mg Intravenous Q6H  . multivitamin with minerals  1 tablet Oral Daily  . pregabalin  150 mg Oral q morning - 10a  . pregabalin  75 mg Oral QPM  . tamsulosin  0.4 mg Oral QPM   Continuous Infusions:   Active Problems:   COPD exacerbation   Hypokalemia   Essential hypertension, benign   Fibromyalgia    Time spent: 35    [facility]  [facility] Pager [phone]. If 7PM-7AM, please contact night-coverage at [email or web address], password [facility] 03/16/2013, 10:10 AM  LOS: 1 day

## 2013-03-17 MED ORDER — ALBUTEROL SULFATE HFA 108 (90 BASE) MCG/ACT IN AERS: 2.0000 | INHALATION_SPRAY | RESPIRATORY_TRACT | Status: DC | PRN

## 2013-03-17 MED ORDER — PREDNISONE 20 MG PO TABS: 40.0000 mg | ORAL_TABLET | Freq: Every day | ORAL | Status: DC

## 2013-03-17 MED ORDER — BENZONATATE 200 MG PO CAPS: 200.0000 mg | ORAL_CAPSULE | Freq: Three times a day (TID) | ORAL | Status: DC

## 2013-03-17 MED ORDER — DIPHENOXYLATE-ATROPINE 2.5-0.025 MG PO TABS: 1.0000 | ORAL_TABLET | Freq: Four times a day (QID) | ORAL | Status: DC

## 2013-03-17 MED ORDER — DM-GUAIFENESIN ER 30-600 MG PO TB12: 1.0000 | ORAL_TABLET | Freq: Two times a day (BID) | ORAL | Status: DC

## 2013-03-17 MED ORDER — BUDESONIDE-FORMOTEROL FUMARATE 160-4.5 MCG/ACT IN AERO: 2.0000 | INHALATION_SPRAY | Freq: Two times a day (BID) | RESPIRATORY_TRACT | Status: DC

## 2013-03-17 MED ORDER — AMOXICILLIN-POT CLAVULANATE 875-125 MG PO TABS: 1.0000 | ORAL_TABLET | Freq: Two times a day (BID) | ORAL | Status: DC

## 2013-03-17 MED ORDER — LORATADINE 10 MG PO TABS: 10.0000 mg | ORAL_TABLET | Freq: Every day | ORAL | Status: DC

## 2013-03-17 MED ORDER — TRAMADOL HCL 50 MG PO TABS: 50.0000 mg | ORAL_TABLET | Freq: Four times a day (QID) | ORAL | Status: AC | PRN

## 2013-03-17 MED ORDER — FLUTICASONE PROPIONATE 50 MCG/ACT NA SUSP: 2.0000 | Freq: Every day | NASAL | Status: DC

## 2013-03-17 NOTE — Discharge Summary (Signed)
Physician Discharge Summary  Hannah Conrad ZOX:[id number] DOB: December 15, 1940 DOA: 03/15/2013  PCP: No primary provider on file.  Admit date: 03/15/2013 Discharge date: 03/17/2013  Time spent: <52minutes  Recommendations for Outpatient Follow-up:  1. PCP in one week, call for appointment upon discharge.  Discharge Diagnoses:  Active Problems:   COPD exacerbation   Hypokalemia   Essential hypertension, benign   Fibromyalgia   Discharge Condition: Improved/stable  Diet recommendation: Heart healthy  Filed Weights   03/15/13 0920  Weight: 65.3 kg (143 lb 15.4 oz)    History of present illness:  Hannah Conrad is a 72 y.o. female with h/o hypertension, CKD stage 2 , fibromyalgia, past smoker, came in for worsening sob since yesterday associated with cough with productive sputum, low grade temp,. On arrival to ED, she was diffusely wheezing, required nasal oxygen. She was given IV steroids and nebs and her breathing improved. She is being admitted to medical service for management of COPD exacerbation.      Hospital Course:   1. COPD exaerbation with bronchitis:  - Upon admission patient was placed on IV steroids, antibiotics and scheduled nebs were added on followup  - -mucolytics/antitussives were also added -She responded well to the above interventions and is much clinically improved on followup today. -She ambulated in hallway on her oxygen sats remained in the 90s. - PT recommended home health PT -She'll be discharged at this time for outpatient followup with her PCP 2. Hypertension; controlled.  -She was maintained on her outpatient medications and is to continue them upon discharge. 3. Hypokalemia: Likely secondary to GI losses, resolved  4. Stage 1 to2 CKD: . Chronic and stable.  -Creatinine normalized. 5. Elevated d dimer/ sob/ recent air travel:  - VQ scan negative for PE  6.Diarrhea  -C. difficile was done and came back negative. Patient was started on  antidiarrheal agent and she is much clinically improved at this time. She is to follow up outpatient with her PCP - Procedures:  none  Consultations:  none  Discharge Exam: Filed Vitals:   03/16/13 1925 03/16/13 2116 03/17/13 0603 03/17/13 0910  BP:  118/64 116/73   Pulse:  88 74   Temp:  97.3 F (36.3 C) 97.6 F (36.4 C)   TempSrc:  Oral Oral   Resp:  18 18   Height:      Weight:      SpO2: 93% 95% 97% 96%   Exam:  General: Elderly female alert and oriented x3 in no apparent distress  Cardiovascular: Regular rate and rhythm, S1-S2  Respiratory: Occasional wheezes, no crackles.  Abdomen: Soft, bowel sounds present nontender and nondistended no organomegaly and no masses palpable  Extremities: No cyanosis and no edema     Discharge Instructions  Discharge Orders   Future Orders Complete By Expires     Diet - low sodium heart healthy  As directed     Increase activity slowly  As directed         Medication List    TAKE these medications       acidophilus Caps  Take 1 capsule by mouth every morning.     albuterol 108 (90 BASE) MCG/ACT inhaler  Commonly known as:  PROVENTIL HFA;VENTOLIN HFA  Inhale 2 puffs into the lungs every 4 (four) hours as needed for wheezing or shortness of breath.     amoxicillin-clavulanate 875-125 MG per tablet  Commonly known as:  AUGMENTIN  Take 1 tablet by mouth 2 (two) times  daily.     aspirin EC 325 MG tablet  Take 325 mg by mouth every evening.     benazepril-hydrochlorthiazide 10-12.5 MG per tablet  Commonly known as:  LOTENSIN HCT  Take 1 tablet by mouth every morning.     benzonatate 200 MG capsule  Commonly known as:  TESSALON  Take 1 capsule (200 mg total) by mouth 3 (three) times daily.     BORAGE 1000 PO  Take 1 capsule by mouth every morning.     budesonide-formoterol 160-4.5 MCG/ACT inhaler  Commonly known as:  SYMBICORT  Inhale 2 puffs into the lungs 2 (two) times daily.     calcium carbonate 1250 MG  tablet  Commonly known as:  OS-CAL - dosed in mg of elemental calcium  Take 1 tablet by mouth every morning.     cholecalciferol 1000 UNITS tablet  Commonly known as:  VITAMIN D  Take 1,000 Units by mouth daily.     dextromethorphan-guaiFENesin 30-600 MG per 12 hr tablet  Commonly known as:  MUCINEX DM  Take 1 tablet by mouth 2 (two) times daily.     diphenoxylate-atropine 2.5-0.025 MG per tablet  Commonly known as:  LOMOTIL  Take 1 tablet by mouth 4 (four) times daily.     estradiol 0.5 MG tablet  Commonly known as:  ESTRACE  Take 0.5 mg by mouth every evening.     felodipine 10 MG 24 hr tablet  Commonly known as:  PLENDIL  Take 10 mg by mouth every evening.     fluticasone 50 MCG/ACT nasal spray  Commonly known as:  FLONASE  Place 2 sprays into the nose daily.     loratadine 10 MG tablet  Commonly known as:  CLARITIN  Take 1 tablet (10 mg total) by mouth daily.     MAGNESIUM PO  Take 1 tablet by mouth every morning.     prednisone 40 mg daily   Commonly known as: DELTASONE  Take 40 mg by mouth every morning for 5days.     multivitamin with minerals Tabs  Take 1 tablet by mouth every morning.     polycarbophil 625 MG tablet  Commonly known as:  FIBERCON  Take 625 mg by mouth every morning.     pregabalin 75 MG capsule  Commonly known as:  LYRICA  Take 75-150 mg by mouth 2 (two) times daily. Take 150mg  in the morning and 75mg  at night     tamsulosin 0.4 MG Caps  Commonly known as:  FLOMAX  Take 0.4 mg by mouth every evening.     traMADol 50 MG tablet  Commonly known as:  ULTRAM  Take 1 tablet (50 mg total) by mouth every 6 (six) hours as needed for pain.     vitamin E 100 UNIT capsule  Take 100 Units by mouth every morning.           Follow-up Information   Please follow up. (PCP in 1week, call for appt upon discharge)        The results of significant diagnostics from this hospitalization (including imaging, microbiology, ancillary and  laboratory) are listed below for reference.    Significant Diagnostic Studies: Dg Chest 2 View  03/15/2013  *RADIOLOGY REPORT*  Clinical Data: Sudden onset of shortness of breath.  CHEST - 2 VIEW  Comparison: Chest radiograph performed 11/02/2009  Findings: The lungs are well-aerated and clear.  There is no evidence of focal opacification, pleural effusion or pneumothorax.  The heart is normal in  size; the mediastinal contour is within normal limits.  No acute osseous abnormalities are seen.  The esophagus appears to be filled with air.  Clips are noted within the right upper quadrant, reflecting prior cholecystectomy.  IMPRESSION: No acute cardiopulmonary process seen.   Original Report Authenticated By: Tonia Ghent, M.D.    Nm Pulmonary Perf And Vent  03/15/2013  *RADIOLOGY REPORT*  Clinical Data: Shortness of breath.  Recent air travel.  Elevated D- dimer.  NM PULMONARY VENTILATION AND PERFUSION SCAN  Radiopharmaceutical: 5.7MILLI CURIE MAA TECHNETIUM TO 75M ALBUMIN AGGREGATED , 39.1 mCi of technetium 25m DTPA.  Comparison: Chest radiograph from 03/15/2013  Findings: There is a heterogeneous distribution of the aerosolized radiopharmaceutical on the ventilation portion of the examination. This may reflect turbulent flow within the large airways perhaps secondary to obstructive pulmonary disease.  On the perfusion portion of the examination there are no suspicious medium or large large size segmental perfusion defects.  IMPRESSION:  1.  Low probability for acute pulmonary embolus. 2.  COPD.   Original Report Authenticated By: Signa Kell, M.D.     Microbiology: Recent Results (from the past 240 hour(s))  CULTURE, BLOOD (ROUTINE X 2)     Status: None   Collection Time    03/15/13  6:00 AM      Result Value Range Status   Specimen Description BLOOD RIGHT ANTECUBITAL   Final   Special Requests BOTTLES DRAWN AEROBIC AND ANAEROBIC 5CC   Final   Culture  Setup Time 03/15/2013 08:32   Final    Culture     Final   Value:        BLOOD CULTURE RECEIVED NO GROWTH TO DATE CULTURE WILL BE HELD FOR 5 DAYS BEFORE ISSUING A FINAL NEGATIVE REPORT   Report Status PENDING   Incomplete  CULTURE, BLOOD (ROUTINE X 2)     Status: None   Collection Time    03/15/13  6:35 AM      Result Value Range Status   Specimen Description BLOOD RIGHT FOREARM   Final   Special Requests BOTTLES DRAWN AEROBIC AND ANAEROBIC 5CC   Final   Culture  Setup Time 03/15/2013 08:32   Final   Culture     Final   Value:        BLOOD CULTURE RECEIVED NO GROWTH TO DATE CULTURE WILL BE HELD FOR 5 DAYS BEFORE ISSUING A FINAL NEGATIVE REPORT   Report Status PENDING   Incomplete  CLOSTRIDIUM DIFFICILE BY PCR     Status: None   Collection Time    03/15/13  5:28 PM      Result Value Range Status   C difficile by pcr NEGATIVE  NEGATIVE Final     Labs: Basic Metabolic Panel:  Recent Labs Lab 03/15/13 0635 03/15/13 1310 03/16/13 0520  NA 136  --  135  K 3.4*  --  4.8  CL 101  --  103  CO2 21  --  26  GLUCOSE 101*  --  133*  BUN 24*  --  28*  CREATININE 1.11* 1.00 1.05  CALCIUM 8.8  --  8.8   Liver Function Tests: No results found for this basename: AST, ALT, ALKPHOS, BILITOT, PROT, ALBUMIN,  in the last 168 hours No results found for this basename: LIPASE, AMYLASE,  in the last 168 hours No results found for this basename: AMMONIA,  in the last 168 hours CBC:  Recent Labs Lab 03/15/13 0635 03/15/13 1310 03/16/13 0520  WBC 6.3 4.9  7.8  NEUTROABS 4.9  --   --   HGB 13.1 12.8 12.3  HCT 39.0 37.7 37.0  MCV 90.5 90.8 89.4  PLT 178 198 173   Cardiac Enzymes: No results found for this basename: CKTOTAL, CKMB, CKMBINDEX, TROPONINI,  in the last 168 hours BNP: BNP (last 3 results)  Recent Labs  03/15/13 1310  PROBNP 256.8*   CBG: No results found for this basename: GLUCAP,  in the last 168 hours     Signed:  Ramonica Grigg C  [facility] 03/17/2013, 1:30 PM

## 2013-03-17 NOTE — Progress Notes (Signed)
Physical Therapy Treatment Patient Details Name: Hannah Conrad MRN: [id number] DOB: December 20, 1940 Today's Date: 03/17/2013 Time: 2956-2130 PT Time Calculation (min): 18 min  PT Assessment / Plan / Recommendation Comments on Treatment Session       Follow Up Recommendations  Home health PT     Does the patient have the potential to tolerate intense rehabilitation     Barriers to Discharge        Equipment Recommendations  None recommended by PT    Recommendations for Other Services OT consult  Frequency Min 3X/week   Plan Discharge plan remains appropriate    Precautions / Restrictions Precautions Precautions: Fall Restrictions Weight Bearing Restrictions: No   Pertinent Vitals/Pain NO c/o pain;  Pt ambulated with O2@2L  and maintained 94% sat. Pt ambulated on RA and maintained 91% sat.  RN aware    Mobility  Bed Mobility Bed Mobility: Supine to Sit;Sit to Supine Supine to Sit: 6: Modified independent (Device/Increase time) Sit to Supine: 6: Modified independent (Device/Increase time) Transfers Transfers: Sit to Stand;Stand to Sit Sit to Stand: 6: Modified independent (Device/Increase time) Stand to Sit: 6: Modified independent (Device/Increase time) Ambulation/Gait Ambulation/Gait Assistance: 5: Supervision;4: Min guard Ambulation Distance (Feet): 350 Feet Assistive device: Rolling walker Ambulation/Gait Assistance Details: min cues for position from RW Gait Pattern: Step-through pattern;Within Functional Limits Stairs: No    Exercises     PT Diagnosis:    PT Problem List:   PT Treatment Interventions:     PT Goals Acute Rehab PT Goals PT Goal Formulation: With patient Time For Goal Achievement: 03/27/13 Potential to Achieve Goals: Good Pt will go Supine/Side to Sit: Independently PT Goal: Supine/Side to Sit - Progress: Progressing toward goal Pt will go Sit to Supine/Side: Independently PT Goal: Sit to Supine/Side - Progress: Progressing toward goal Pt  will go Sit to Stand: with modified independence PT Goal: Sit to Stand - Progress: Met Pt will go Stand to Sit: with modified independence PT Goal: Stand to Sit - Progress: Met Pt will Ambulate: >150 feet;with supervision;with least restrictive assistive device PT Goal: Ambulate - Progress: Progressing toward goal Pt will Go Up / Down Stairs: 1-2 stairs;with rail(s);with min assist  Visit Information  Last PT Received On: 03/17/13 Assistance Needed: +1    Subjective Data  Subjective: They gave me some medicine yesterday that made me feel much better Patient Stated Goal: Home to resume previous lifestyle   Cognition  Cognition Arousal/Alertness: Awake/alert Behavior During Therapy: WFL for tasks assessed/performed Overall Cognitive Status: Within Functional Limits for tasks assessed    Balance     End of Session PT - End of Session Equipment Utilized During Treatment: Gait belt;Oxygen Activity Tolerance: Patient tolerated treatment well Patient left: in bed;with call bell/phone within reach;with nursing in room Nurse Communication: Mobility status;Other (comment) (O2 sats wtih ambulation)   GP     Kerrie Timm 03/17/2013, 8:27 AM

## 2013-03-17 NOTE — Care Management Note (Signed)
   CARE MANAGEMENT NOTE 03/17/2013  Patient:  Hannah Conrad, Hannah Conrad   Account Number:  [id number]  Date Initiated:  03/17/2013  Documentation initiated by:  Raiford Noble  Subjective/Objective Assessment:   adm with copd     Action/Plan:   dc home with home health PT services   Anticipated DC Date:  03/17/2013   Anticipated DC Plan:  HOME W HOME HEALTH SERVICES  In-house referral  NA      DC Planning Services  CM consult      [facility] Choice  HOME HEALTH   Choice offered to / List presented to:  C-1 Patient   DME arranged  NA      DME agency  NA     HH arranged  HH - 11 Patient Refused      HH agency  NA   Status of service:  Completed, signed off Medicare Important Message given?   (If response is "NO", the following Medicare IM given date fields will be blank) Date Medicare IM given:   Date Additional Medicare IM given:    Discharge Disposition:  HOME/SELF CARE  Per UR Regulation:    If discussed at Long Length of Stay Meetings, dates discussed:    Comments:  03/17/13 [facility] [phone] Received call from patient's nurse requesting HHC PT be arranged. However, when patient asked about home health choices, patient reports to her nurse "I do not think I need it". Patient declined home health services as well as walker. Patient stated to nurse (while this writer was listening), " I think I am doing pretty well". Asked nursing to make MD aware that patient refused HHC setup. No further needs assessed. [email or web address] [phone]

## 2013-03-21 LAB — CULTURE, BLOOD (ROUTINE X 2): Culture: NO GROWTH

## 2013-05-16 ENCOUNTER — Other Ambulatory Visit: Payer: Self-pay | Admitting: Obstetrics and Gynecology

## 2014-07-08 ENCOUNTER — Ambulatory Visit: Payer: MEDICARE | Attending: Neurosurgery | Admitting: Physical Therapy

## 2014-07-08 DIAGNOSIS — M545 Low back pain, unspecified: Secondary | ICD-10-CM | POA: Insufficient documentation

## 2014-07-08 DIAGNOSIS — M542 Cervicalgia: Secondary | ICD-10-CM | POA: Diagnosis present

## 2014-07-15 ENCOUNTER — Ambulatory Visit: Payer: MEDICARE | Admitting: Physical Therapy

## 2014-07-15 DIAGNOSIS — M542 Cervicalgia: Secondary | ICD-10-CM | POA: Diagnosis not present

## 2014-07-17 ENCOUNTER — Ambulatory Visit: Payer: MEDICARE | Admitting: Physical Therapy

## 2014-07-17 DIAGNOSIS — M542 Cervicalgia: Secondary | ICD-10-CM | POA: Diagnosis not present

## 2014-07-22 ENCOUNTER — Ambulatory Visit: Payer: MEDICARE | Admitting: Physical Therapy

## 2014-07-22 DIAGNOSIS — M542 Cervicalgia: Secondary | ICD-10-CM | POA: Diagnosis not present

## 2014-07-24 ENCOUNTER — Ambulatory Visit: Payer: MEDICARE | Admitting: Physical Therapy

## 2014-07-24 DIAGNOSIS — M542 Cervicalgia: Secondary | ICD-10-CM | POA: Diagnosis not present

## 2014-07-29 ENCOUNTER — Ambulatory Visit: Payer: MEDICARE | Attending: Neurosurgery | Admitting: Physical Therapy

## 2014-07-29 DIAGNOSIS — M542 Cervicalgia: Secondary | ICD-10-CM | POA: Diagnosis present

## 2014-07-29 DIAGNOSIS — M545 Low back pain, unspecified: Secondary | ICD-10-CM | POA: Diagnosis not present

## 2014-07-31 ENCOUNTER — Ambulatory Visit: Payer: MEDICARE | Admitting: Physical Therapy

## 2015-05-25 ENCOUNTER — Other Ambulatory Visit: Payer: Self-pay | Admitting: Obstetrics and Gynecology

## 2015-05-26 LAB — CYTOLOGY - PAP

## 2016-08-04 ENCOUNTER — Encounter: Payer: Self-pay | Admitting: Physician Assistant

## 2016-08-04 ENCOUNTER — Encounter (INDEPENDENT_AMBULATORY_CARE_PROVIDER_SITE_OTHER): Payer: Self-pay

## 2016-08-04 ENCOUNTER — Ambulatory Visit (INDEPENDENT_AMBULATORY_CARE_PROVIDER_SITE_OTHER): Payer: Medicare HMO | Admitting: Physician Assistant

## 2016-08-04 VITALS — BP 112/76 | HR 88 | Ht 61.0 in | Wt 138.7 lb

## 2016-08-04 DIAGNOSIS — R197 Diarrhea, unspecified: Secondary | ICD-10-CM

## 2016-08-04 MED ORDER — DIPHENOXYLATE-ATROPINE 2.5-0.025 MG PO TABS: 2.0000 | ORAL_TABLET | Freq: Two times a day (BID) | ORAL | 0 refills | Status: DC | PRN

## 2016-08-04 MED ORDER — NA SULFATE-K SULFATE-MG SULF 17.5-3.13-1.6 GM/177ML PO SOLN: 1.0000 | ORAL | 0 refills | Status: DC

## 2016-08-04 NOTE — Progress Notes (Signed)
Chief Complaint: Diarrhea  HPI:  Hannah Conrad is a 75 year old African-American female who presents clinic today, as a referral from Dr. Glendon Axe at [facility] , for a chief complaint of chronic diarrhea.  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 The patient has done well with no diarrhea since time of that surgery until now. Over the past year she has had an increasing frequency in the amount of diarrhea she experiences. She tells me that at first she would go 1-2 weeks with loose stools, but then this will clear up for a period of time and then come back. Most recently she has a liquid urgent bowel movement within 15 minutes of eating anything or drinking anything. She tells me that no matter if she takes "a sip of water or coffee or eats a jelly bean", she will have a liquid stool. This is very frustrating for her as she cannot leave the house. She has tried various probiotics as well as Imodium and a digestive enzyme which have made no change. Patient does recall being on what sounds like colestipol around 15 years ago for diagnosis of IBS D, which "didn't help back then". Patient also notes a decrease in weight from 148 pounds to 137 today, over the past year.  Past medical history is positive for a colonoscopy at [facility] in 2010, patient tells me she was told to return in 10 years from then for a repeat screening. Patient is currently on Augmentin for a cat bite.  Patient denies fever, chills, blood in her stool, melena, antibiotic use before start of diarrhea, change in diet, change in medications, fatigue, anorexia, nausea, vomiting, heartburn, reflux or abdominal pain.  Past Medical History:  Diagnosis  Date  . Fibromyalgia   . Hypertension   . Kidney disease   . SOB (shortness of breath)   . Spinal stenosis     Past Surgical History:  Procedure Laterality Date  . CHOLECYSTECTOMY    . COLON SURGERY    . RECTOVAGINAL FISTULA CLOSURE      Current Outpatient Prescriptions  Medication Sig Dispense Refill  . amoxicillin-clavulanate (AUGMENTIN) 875-125 MG per tablet Take 1 tablet by mouth 2 (two) times daily. 12 tablet 0  . aspirin EC 325 MG tablet Take 325 mg by mouth every evening.    Marland Kitchen estradiol (ESTRACE) 0.5 MG tablet Take 0.5 mg by mouth every evening.    . felodipine (PLENDIL) 10 MG 24 hr tablet Take 10 mg by mouth every evening.    . folic acid (FOLVITE) 1 MG tablet Take 1 mg by mouth daily.    Marland Kitchen gabapentin (NEURONTIN) 600 MG tablet Take 600 mg by mouth 3 (three) times daily.    Marland Kitchen leflunomide (ARAVA) 20 MG tablet Take 20 mg by mouth daily.    Marland Kitchen MAGNESIUM PO Take 1 tablet by mouth every morning.    . traMADol (ULTRAM) 50 MG tablet Take 1 tablet (50 mg total) by mouth every 6 (six) hours as needed for pain. 30 tablet 0  . vitamin E 100 UNIT capsule Take 100 Units by mouth every morning.    . diphenoxylate-atropine (LOMOTIL) 2.5-0.025 MG tablet Take 2 tablets by mouth 2 (two) times  daily as needed for diarrhea or loose stools. 30 tablet 0  . Na Sulfate-K Sulfate-Mg Sulf 17.5-3.13-1.6 GM/180ML SOLN Take 1 kit by mouth as directed. 354 mL 0   No current facility-administered medications for this visit.     Allergies as of 08/04/2016 - Review Complete 08/04/2016  Allergen Reaction Noted  . Ciprofloxacin Anaphylaxis 03/15/2013  . Septra [sulfamethoxazole-trimethoprim] Anaphylaxis 03/15/2013    Family History  Problem Relation Age of Onset  . Breast cancer Mother   . Lung cancer Mother   . Kidney disease Father   . Asthma Father   . Asthma Brother     Social History   Social History  . Marital status: Divorced    Spouse name: N/A  . Number of children: N/A  . Years of  education: N/A   Occupational History  . Not on file.   Social History Main Topics  . Smoking status: Former Smoker    Quit date: 12/15/2008  . Smokeless tobacco: Never Used  . Alcohol use Yes     Comment: Rare ,socailly  . Drug use: Unknown  . Sexual activity: Not Currently   Other Topics Concern  . Not on file   Social History Narrative  . No narrative on file    Review of Systems:    Constitutional: Per pt, weight loss of 10 pounds in the past year No fever, chills, weakness or fatigue HEENT: Eyes: No change in vision               Ears, Nose, Throat:  No change in hearing or congestion Skin: No rash or itching Cardiovascular: No chest pain, chest pressure or paplitations Respiratory: No SOB or cough Gastrointestinal: See HPI and otherwise negative Genitourinary: No dysuria or change in urinary frequency Neurological: No headache, dizziness or syncope Musculoskeletal: No new muscle or back pain Hematologic: No anemia, bleeding or bruising Psychiatric: No history of depression or anxiety   Physical Exam:  Vital signs: BP 112/76 (BP Location: Right Arm, Patient Position: Sitting, Cuff Size: Normal)   Pulse 88   Ht 5' 1"  (1.549 m)   Wt 138 lb 11.2 oz (62.9 kg)   BMI 26.21 kg/m   General:   Pleasant elderly African American female appears to be in NAD, Well developed, Well nourished, alert and cooperative Head:  Normocephalic and atraumatic. Eyes:   PEERL, EOMI. No icterus. Conjunctiva pink. Ears:  Normal auditory acuity. Neck:  Supple Throat: Oral cavity and pharynx without inflammation, swelling or lesion.  Lungs: Respirations even and unlabored. Lungs clear to auscultation bilaterally.   No wheezes, crackles, or rhonchi.  Heart: Normal S1, S2. No MRG. Regular rate and rhythm. No peripheral edema, cyanosis or pallor.  Abdomen:  Soft, nondistended, nontender. No rebound or guarding. Normal bowel sounds. No appreciable masses or hepatomegaly. Rectal:  Not performed.   Msk:  Symmetrical without gross deformities. Peripheral pulses intact.  Extremities:  Without edema, no deformity or joint abnormality. Neurologic:  Alert and  oriented x4;  grossly normal neurologically.  Skin:   Dry and intact without significant lesions or rashes. Psychiatric: Oriented to person, place and time. Demonstrates good judgement and reason without abnormal affect or behaviors.  RELEVANT LABS AND IMAGING: See history of present illness  Assessment: 1. Diarrhea: Over the past year increasing in frequency, stool studies pending, most recent colonoscopy in 2010, vague history of being on what sounds like colestipol in the past which did help, history of previous cholecystectomy; consider infectious cause versus microscopic colitis  versus functional diarrhea versus IBS vs other  Plan: 1. Recommend Colonoscopy for further eval of worsening diarrhea. Discussed risks, benefits, limitations and alternatives of this procedure and the patient agrees to proceed. She'll be scheduled with Dr. Silverio Decamp as she is new to our practice. 2. Requested records from patient's last colonoscopy in 2010 at [facility]. 3. Will request results from patient's recent stool studies which she said were submitted this morning, if no signs of infectious cause patient can start Lomotil 1-2 tabs BID. 4. Prescribed Lomotil 1-2 tabs PO BID. Recommend patient wait to start these until results of stool studies received. 5. Patient to follow in clinic per Dr. Woodward Ku recommendations in the future.  Ellouise Newer, PA-C [facility] 08/04/2016, 11:20 AM  Cc:Dr. Florencia [facility]

## 2016-08-04 NOTE — Patient Instructions (Signed)
We have sent the following medications to your pharmacy for you to pick up at your convenience: Lomotil (do not start until after you have finished your stool studies)  You have been scheduled for a colonoscopy. Please follow written instructions given to you at your visit today.  Please pick up your prep supplies at the pharmacy within the next 1-3 days. If you use inhalers (even only as needed), please bring them with you on the day of your procedure.

## 2016-08-04 NOTE — Progress Notes (Signed)
Reviewed and agree with documentation and assessment and plan. K. Veena Jaidon Sponsel , MD   

## 2016-08-13 ENCOUNTER — Other Ambulatory Visit: Payer: Self-pay | Admitting: Physician Assistant

## 2016-08-17 ENCOUNTER — Ambulatory Visit (AMBULATORY_SURGERY_CENTER): Payer: Medicare HMO | Admitting: Gastroenterology

## 2016-08-17 ENCOUNTER — Telehealth: Payer: Self-pay | Admitting: Physician Assistant

## 2016-08-17 ENCOUNTER — Encounter: Payer: Self-pay | Admitting: Gastroenterology

## 2016-08-17 VITALS — BP 142/62 | HR 79 | Temp 98.7°F | Resp 14 | Ht 61.0 in | Wt 138.0 lb

## 2016-08-17 DIAGNOSIS — R197 Diarrhea, unspecified: Secondary | ICD-10-CM | POA: Diagnosis not present

## 2016-08-17 MED ORDER — SODIUM CHLORIDE 0.9 % IV SOLN: 500.0000 mL | INTRAVENOUS | Status: AC

## 2016-08-17 NOTE — Progress Notes (Signed)
Called to room to assist during endoscopic procedure.  Patient ID and intended procedure confirmed with present staff. Received instructions for my participation in the procedure from the performing physician.  

## 2016-08-17 NOTE — Telephone Encounter (Signed)
Pt is here now for a colonoscopy, she was advised to speak to Dr Lavon PaganiniNandigam about the lomotil refill.

## 2016-08-17 NOTE — Op Note (Signed)
[facility] Patient Name: Hannah DuckingKaren Conrad Procedure Date: 08/17/2016 4:11 PM MRN: [id number] Endoscopist: Napoleon FormKavitha V. Elanna Bert , MD Age: [age] Referring MD:  Date of Birth: July 03, 1941 Gender: Female Account #: [id number] Procedure:                Colonoscopy Indications:              Clinically significant diarrhea of unexplained                            origin, Clinically significant diarrhea of                            unexplained origin Medicines:                Monitored Anesthesia Care Procedure:                Pre-Anesthesia Assessment:                           - Prior to the procedure, a History and Physical                            was performed, and patient medications and                            allergies were reviewed. The patient's tolerance of                            previous anesthesia was also reviewed. The risks                            and benefits of the procedure and the sedation                            options and risks were discussed with the patient.                            All questions were answered, and informed consent                            was obtained. Prior Anticoagulants: The patient has                            taken no previous anticoagulant or antiplatelet                            agents. ASA Grade Assessment: II - A patient with                            mild systemic disease. After reviewing the risks                            and benefits, the patient was deemed in  satisfactory condition to undergo the procedure.                           After obtaining informed consent, the colonoscope                            was passed under direct vision. Throughout the                            procedure, the patient's blood pressure, pulse, and                            oxygen saturations were monitored continuously. The                            Model CF-HQ190L [id number]) scope was  introduced                            through the anus and advanced to the the cecum,                            identified by appendiceal orifice and ileocecal                            valve. The colonoscopy was performed without                            difficulty. The patient tolerated the procedure                            well. The quality of the bowel preparation was                            good. The ileocecal valve, appendiceal orifice, and                            rectum were photographed. Scope In: 4:18:05 PM Scope Out: 4:31:26 PM Scope Withdrawal Time: 0 hours 10 minutes 21 seconds  Total Procedure Duration: 0 hours 13 minutes 21 seconds  Findings:                 The perianal and digital rectal examinations were                            normal.                           Biopsies for histology were taken with a cold                            forceps from the entire colon for evaluation of                            microscopic colitis.  The exam was otherwise without abnormality. Complications:            No immediate complications. Estimated Blood Loss:     Estimated blood loss was minimal. Impression:               - The examination was otherwise normal.                           - Biopsies were taken with a cold forceps from the                            entire colon for evaluation of microscopic colitis. Recommendation:           - Patient has a contact number available for                            emergencies. The signs and symptoms of potential                            delayed complications were discussed with the                            patient. Return to normal activities tomorrow.                            Written discharge instructions were provided to the                            patient.                           - Resume previous diet.                           - Continue present medications.                            - Await pathology results.                           - Repeat colonoscopy in 10 years for screening                            purposes.                           - Return to GI clinic PRN. Napoleon Form, MD 08/17/2016 4:36:13 PM This report has been signed electronically.

## 2016-08-17 NOTE — Patient Instructions (Addendum)
Biopsies taken today. Resume current medications. Repeat colonoscopy in 10 years. Call us with any questions or concerns. Thank you!  YOU HAD AN ENDOSCOPIC PROCEDURE TODAY AT THE [facility]:   Refer to the procedure report that was given to you for any specific questions about what was found during the examination.  If the procedure report does not answer your questions, please call your gastroenterologist to clarify.  If you requested that your care partner not be given the details of your procedure findings, then the procedure report has been included in a sealed envelope for you to review at your convenience later.  YOU SHOULD EXPECT: Some feelings of bloating in the abdomen. Passage of more gas than usual.  Walking can help get rid of the air that was put into your GI tract during the procedure and reduce the bloating. If you had a lower endoscopy (such as a colonoscopy or flexible sigmoidoscopy) you may notice spotting of blood in your stool or on the toilet paper. If you underwent a bowel prep for your procedure, you may not have a normal bowel movement for a few days.  Please Note:  You might notice some irritation and congestion in your nose or some drainage.  This is from the oxygen used during your procedure.  There is no need for concern and it should clear up in a day or so.  SYMPTOMS TO REPORT IMMEDIATELY:   Following lower endoscopy (colonoscopy or flexible sigmoidoscopy):  Excessive amounts of blood in the stool  Significant tenderness or worsening of abdominal pains  Swelling of the abdomen that is new, acute  Fever of 100F or higher   For urgent or emergent issues, a gastroenterologist can be reached at any hour by calling [phone].   DIET:  We do recommend a small meal at first, but then you may proceed to your regular diet.  Drink plenty of fluids but you should avoid alcoholic beverages for 24 hours.  ACTIVITY:  You should plan to take it easy for the  rest of today and you should NOT DRIVE or use heavy machinery until tomorrow (because of the sedation medicines used during the test).    FOLLOW UP: Our staff will call the number listed on your records the next business day following your procedure to check on you and address any questions or concerns that you may have regarding the information given to you following your procedure. If we do not reach you, we will leave a message.  However, if you are feeling well and you are not experiencing any problems, there is no need to return our call.  We will assume that you have returned to your regular daily activities without incident.  If any biopsies were taken you will be contacted by phone or by letter within the next 1-3 weeks.  Please call us at [phone] if you have not heard about the biopsies in 3 weeks.    SIGNATURES/CONFIDENTIALITY: You and/or your care partner have signed paperwork which will be entered into your electronic medical record.  These signatures attest to the fact that that the information above on your After Visit Summary has been reviewed and is understood.  Full responsibility of the confidentiality of this discharge information lies with you and/or your care-partner.

## 2016-08-18 ENCOUNTER — Telehealth: Payer: Self-pay | Admitting: Gastroenterology

## 2016-08-18 ENCOUNTER — Telehealth: Payer: Self-pay | Admitting: *Deleted

## 2016-08-18 MED ORDER — DIPHENOXYLATE-ATROPINE 2.5-0.025 MG PO TABS: 2.0000 | ORAL_TABLET | Freq: Two times a day (BID) | ORAL | 2 refills | Status: AC | PRN

## 2016-08-18 NOTE — Telephone Encounter (Signed)
  Follow up Call-  Call back number 08/17/2016  Post procedure Call Back phone  # [phone]  Permission to leave phone message Yes  Some recent data might be hidden     Patient questions:  Do you have a fever, pain , or abdominal swelling? No. Pain Score  0 *  Have you tolerated food without any problems? Yes.    Have you been able to return to your normal activities? Yes.    Do you have any questions about your discharge instructions: Diet   No. Medications  No. Follow up visit  No.  Do you have questions or concerns about your Care? No.  Actions: * If pain score is 4 or above: No action needed, pain <4.

## 2016-08-18 NOTE — Telephone Encounter (Signed)
Med faxed to pharmacy

## 2016-08-18 NOTE — Telephone Encounter (Signed)
Dr Lavon PaganiniNandigam, Is it ok to send Lomotil for this patient

## 2016-08-18 NOTE — Telephone Encounter (Signed)
Ok to refill Lomotil .Thanks

## 2016-08-26 ENCOUNTER — Encounter: Payer: Self-pay | Admitting: Gastroenterology

## 2023-04-18 ENCOUNTER — Other Ambulatory Visit: Payer: Self-pay

## 2023-04-18 DIAGNOSIS — Z1231 Encounter for screening mammogram for malignant neoplasm of breast: Secondary | ICD-10-CM
